# Patient Record
Sex: Male | Born: 1970
Health system: Southern US, Community
[De-identification: ages and names within clinical notes are randomized; demographics above are authoritative.]

## PROBLEM LIST (undated history)

## (undated) DIAGNOSIS — G4730 Sleep apnea, unspecified: Secondary | ICD-10-CM

## (undated) DIAGNOSIS — I10 Essential (primary) hypertension: Secondary | ICD-10-CM

## (undated) DIAGNOSIS — H269 Unspecified cataract: Secondary | ICD-10-CM

## (undated) DIAGNOSIS — K219 Gastro-esophageal reflux disease without esophagitis: Secondary | ICD-10-CM

## (undated) HISTORY — DX: Sleep apnea, unspecified: G47.30

## (undated) HISTORY — PX: DENTAL RESTORATION/EXTRACTION WITH X-RAY: SHX5796

## (undated) HISTORY — DX: Unspecified cataract: H26.9

## (undated) HISTORY — DX: Essential (primary) hypertension: I10

## (undated) HISTORY — DX: Gastro-esophageal reflux disease without esophagitis: K21.9

---

## 2010-11-03 HISTORY — PX: CATARACT EXTRACTION: SUR2

## 2016-03-28 DIAGNOSIS — R7303 Prediabetes: Secondary | ICD-10-CM | POA: Diagnosis not present

## 2016-03-28 DIAGNOSIS — Z1389 Encounter for screening for other disorder: Secondary | ICD-10-CM | POA: Diagnosis not present

## 2016-03-28 DIAGNOSIS — I10 Essential (primary) hypertension: Secondary | ICD-10-CM | POA: Diagnosis not present

## 2016-03-28 DIAGNOSIS — M545 Low back pain: Secondary | ICD-10-CM | POA: Diagnosis not present

## 2016-03-28 DIAGNOSIS — E7800 Pure hypercholesterolemia, unspecified: Secondary | ICD-10-CM | POA: Diagnosis not present

## 2016-03-28 DIAGNOSIS — Z0000 Encounter for general adult medical examination without abnormal findings: Secondary | ICD-10-CM | POA: Diagnosis not present

## 2016-03-28 DIAGNOSIS — G4719 Other hypersomnia: Secondary | ICD-10-CM | POA: Diagnosis not present

## 2016-07-09 DIAGNOSIS — G4733 Obstructive sleep apnea (adult) (pediatric): Secondary | ICD-10-CM | POA: Diagnosis not present

## 2016-08-08 DIAGNOSIS — G4733 Obstructive sleep apnea (adult) (pediatric): Secondary | ICD-10-CM | POA: Diagnosis not present

## 2016-09-08 DIAGNOSIS — G4733 Obstructive sleep apnea (adult) (pediatric): Secondary | ICD-10-CM | POA: Diagnosis not present

## 2016-12-18 DIAGNOSIS — B354 Tinea corporis: Secondary | ICD-10-CM | POA: Diagnosis not present

## 2016-12-18 DIAGNOSIS — L918 Other hypertrophic disorders of the skin: Secondary | ICD-10-CM | POA: Diagnosis not present

## 2017-02-25 DIAGNOSIS — R197 Diarrhea, unspecified: Secondary | ICD-10-CM | POA: Diagnosis not present

## 2017-02-25 DIAGNOSIS — Z6836 Body mass index (BMI) 36.0-36.9, adult: Secondary | ICD-10-CM | POA: Diagnosis not present

## 2017-02-25 DIAGNOSIS — E669 Obesity, unspecified: Secondary | ICD-10-CM | POA: Diagnosis not present

## 2017-03-16 DIAGNOSIS — R197 Diarrhea, unspecified: Secondary | ICD-10-CM | POA: Diagnosis not present

## 2017-03-16 DIAGNOSIS — Z6835 Body mass index (BMI) 35.0-35.9, adult: Secondary | ICD-10-CM | POA: Diagnosis not present

## 2017-03-16 DIAGNOSIS — R1013 Epigastric pain: Secondary | ICD-10-CM | POA: Diagnosis not present

## 2017-03-16 DIAGNOSIS — I10 Essential (primary) hypertension: Secondary | ICD-10-CM | POA: Diagnosis not present

## 2017-03-18 ENCOUNTER — Encounter: Payer: Self-pay | Admitting: Gastroenterology

## 2017-03-18 DIAGNOSIS — R1013 Epigastric pain: Secondary | ICD-10-CM | POA: Diagnosis not present

## 2017-03-18 DIAGNOSIS — R197 Diarrhea, unspecified: Secondary | ICD-10-CM | POA: Diagnosis not present

## 2017-03-24 ENCOUNTER — Encounter: Payer: Self-pay | Admitting: Gastroenterology

## 2017-03-24 ENCOUNTER — Encounter (INDEPENDENT_AMBULATORY_CARE_PROVIDER_SITE_OTHER): Payer: Self-pay

## 2017-03-24 ENCOUNTER — Other Ambulatory Visit (INDEPENDENT_AMBULATORY_CARE_PROVIDER_SITE_OTHER): Payer: BLUE CROSS/BLUE SHIELD

## 2017-03-24 ENCOUNTER — Ambulatory Visit (INDEPENDENT_AMBULATORY_CARE_PROVIDER_SITE_OTHER): Payer: BLUE CROSS/BLUE SHIELD | Admitting: Gastroenterology

## 2017-03-24 VITALS — BP 136/90 | HR 80 | Ht 69.0 in | Wt 238.4 lb

## 2017-03-24 DIAGNOSIS — R1084 Generalized abdominal pain: Secondary | ICD-10-CM

## 2017-03-24 DIAGNOSIS — R194 Change in bowel habit: Secondary | ICD-10-CM

## 2017-03-24 DIAGNOSIS — K625 Hemorrhage of anus and rectum: Secondary | ICD-10-CM | POA: Insufficient documentation

## 2017-03-24 LAB — TSH: TSH: 1.48 u[IU]/mL (ref 0.35–4.50)

## 2017-03-24 LAB — COMPREHENSIVE METABOLIC PANEL
ALK PHOS: 94 U/L (ref 39–117)
ALT: 26 U/L (ref 0–53)
AST: 16 U/L (ref 0–37)
Albumin: 4.1 g/dL (ref 3.5–5.2)
BILIRUBIN TOTAL: 0.4 mg/dL (ref 0.2–1.2)
BUN: 13 mg/dL (ref 6–23)
CO2: 25 mEq/L (ref 19–32)
Calcium: 9.1 mg/dL (ref 8.4–10.5)
Chloride: 104 mEq/L (ref 96–112)
Creatinine, Ser: 1.04 mg/dL (ref 0.40–1.50)
GFR: 81.33 mL/min (ref >60.00)
GLUCOSE: 88 mg/dL (ref 70–99)
Potassium: 4 mEq/L (ref 3.5–5.1)
SODIUM: 137 meq/L (ref 135–145)
TOTAL PROTEIN: 7.3 g/dL (ref 6.0–8.3)

## 2017-03-24 LAB — CBC WITH DIFFERENTIAL/PLATELET
BASOS ABS: 0.1 10*3/uL (ref 0.0–0.1)
Basophils Relative: 0.6 % (ref 0.0–3.0)
Eosinophils Absolute: 0.2 10*3/uL (ref 0.0–0.7)
Eosinophils Relative: 1.8 % (ref 0.0–5.0)
HCT: 43.1 % (ref 39.0–52.0)
Hemoglobin: 14.7 g/dL (ref 13.0–17.0)
LYMPHS ABS: 3.2 10*3/uL (ref 0.7–4.0)
Lymphocytes Relative: 34 % (ref 12.0–46.0)
MCHC: 34.2 g/dL (ref 30.0–36.0)
MCV: 91.2 fl (ref 78.0–100.0)
MONO ABS: 1 10*3/uL (ref 0.1–1.0)
Monocytes Relative: 10.4 % (ref 3.0–12.0)
NEUTROS PCT: 53.2 % (ref 43.0–77.0)
Neutro Abs: 5.1 10*3/uL (ref 1.4–7.7)
Platelets: 370 10*3/uL (ref 150.0–400.0)
RBC: 4.72 Mil/uL (ref 4.22–5.81)
RDW: 13.6 % (ref 11.5–15.5)
WBC: 9.5 10*3/uL (ref 4.0–10.5)

## 2017-03-24 LAB — HIGH SENSITIVITY CRP: CRP, High Sensitivity: 22.46 mg/L — ABNORMAL HIGH (ref 0.000–5.000)

## 2017-03-24 MED ORDER — HYOSCYAMINE SULFATE SL 0.125 MG SL SUBL: 1.0000 | SUBLINGUAL_TABLET | Freq: Four times a day (QID) | SUBLINGUAL | 1 refills | Status: DC

## 2017-03-24 NOTE — Progress Notes (Signed)
03/24/2017 Jeffery Walker [id number] [birth year]   HISTORY OF PRESENT ILLNESS:  This is a 46 year old male who is new to our practice.  He presents to our office today at the request of Charlott Holler, FNP, for evaluation regarding abdominal pain and change in bowel habits.  He tells me that one month ago he had sudden onset of significant diarrhea, which he believes was due to some type of virus, etc. He says that after that he felt better for 3 or 4 days and then he started having abdominal pain. He describes it as a mid abdominal pain that feels like needles. He says that it usually lasts for 10-15 minutes then subsides. It's not necessarily associated with eating. He continues with loose stools that are also more frequent than what he used to have. He says that he used to move his bowels every other day or so. Still having 2 or 3 stools a day that are sometimes loose and watery. Has urgency as well. He's had loss of appetite and fatigue. Some bloating.  He says that when he was having all of the diarrhea he did see some bright red blood. He says that his bowel movements have been waking him up at 3:30 every morning.  Had stool studies performed by PCP but we do not have those results.   Past Medical History:  Diagnosis Date  . Hypertension    History reviewed. No pertinent surgical history.  reports that he has been smoking.  He has never used smokeless tobacco. He reports that he drinks alcohol. He reports that he does not use drugs. family history includes Diabetes in his mother; Heart disease in his father. No Known Allergies    Outpatient Encounter Prescriptions as of 03/24/2017  Medication Sig  . esomeprazole (NEXIUM) 20 MG capsule Take 20 mg by mouth daily at 12 noon.   No facility-administered encounter medications on file as of 03/24/2017.      REVIEW OF SYSTEMS  : All other systems reviewed and negative except where noted in the History of Present Illness.   PHYSICAL  EXAM: BP 136/90   Pulse 80   Ht 5\' 9"  (1.753 m)   Wt 238 lb 6.4 oz (108.1 kg)   BMI 35.21 kg/m  General: Well developed white male in no acute distress Head: Normocephalic and atraumatic Eyes:  Sclerae anicteric, conjunctiva pink. Ears: Normal auditory acuity Lungs: Clear throughout to auscultation Heart: Regular rate and rhythm Abdomen: Soft, non-distended. Normal bowel sounds.  Mild mid-abdominal TTP. Rectal:  Will be done at the time of colonoscopy. Musculoskeletal: Symmetrical with no gross deformities  Skin: No lesions on visible extremities Extremities: No edema  Neurological: Alert oriented x 4, grossly non-focal Psychological:  Alert and cooperative. Normal mood and affect  ASSESSMENT AND PLAN: -46 year old male with what sounds like an acute episode of gastroenteritis.  Those acute symptoms resolved but now with persistent change in bowel habits/more frequent/looser stools and abdominal pain.  I think that he may just have some post-infectious IBS symptoms, but due to patient's concern we will schedule a colonoscopy.  Also reports minimal amounts of rectal bleeding.  We will get results of stool studies that were ordered by his PCP last week.  I will check labs today including CBC, CMP, TSH, CRP.  Will try levsin 0.125 mg SL prn to see if that helps.  Suggested a daily probiotic as well.   CC:  No ref. provider found

## 2017-03-24 NOTE — Patient Instructions (Signed)
Please go to the basement level to have your labs drawn.  We have sent the following medications to your pharmacy for you to pick up at your convenience: 1. Levsin SL 0.125 mg  You have been scheduled for a colonoscopy. Please follow written instructions given to you at your visit today.  If you use inhalers (even only as needed), please bring them with you on the day of your procedure. Your physician has requested that you go to www.startemmi.com and enter the access code given to you at your visit today. This web site gives a general overview about your procedure. However, you should still follow specific instructions given to you by our office regarding your preparation for the procedure.  We have provided you with a sample of the Suprep for the colonoscopy.

## 2017-03-24 NOTE — Progress Notes (Signed)
I agree with the above note, plan 

## 2017-03-25 ENCOUNTER — Encounter: Payer: Self-pay | Admitting: Gastroenterology

## 2017-03-25 ENCOUNTER — Ambulatory Visit (AMBULATORY_SURGERY_CENTER): Payer: BLUE CROSS/BLUE SHIELD | Admitting: Gastroenterology

## 2017-03-25 VITALS — BP 122/73 | HR 64 | Temp 97.5°F | Resp 14 | Ht 69.0 in | Wt 238.0 lb

## 2017-03-25 DIAGNOSIS — R194 Change in bowel habit: Secondary | ICD-10-CM

## 2017-03-25 DIAGNOSIS — K529 Noninfective gastroenteritis and colitis, unspecified: Secondary | ICD-10-CM

## 2017-03-25 DIAGNOSIS — K633 Ulcer of intestine: Secondary | ICD-10-CM | POA: Diagnosis not present

## 2017-03-25 MED ORDER — SODIUM CHLORIDE 0.9 % IV SOLN: 500.0000 mL | INTRAVENOUS | Status: DC

## 2017-03-25 NOTE — Progress Notes (Signed)
Report to PACU, RN, vss, BBS= Clear.  

## 2017-03-25 NOTE — Op Note (Signed)
[facility] Patient Name: Jeffery Walker Procedure Date: 03/25/2017 1:51 PM MRN: [id number] Endoscopist: Milus Banister , MD Age: 46 Referring MD:  Date of Birth: 1971/01/14 Gender: Male Account #: [id number] Procedure:                Colonoscopy Indications:              Generalized abdominal pain, Clinically significant                            diarrhea of unexplained origin; for the poast 4-5                            weeks, elevated sed rate Medicines:                Monitored Anesthesia Care Procedure:                Pre-Anesthesia Assessment:                           - Prior to the procedure, a History and Physical                            was performed, and patient medications and                            allergies were reviewed. The patient's tolerance of                            previous anesthesia was also reviewed. The risks                            and benefits of the procedure and the sedation                            options and risks were discussed with the patient.                            All questions were answered, and informed consent                            was obtained. Prior Anticoagulants: The patient has                            taken no previous anticoagulant or antiplatelet                            agents. ASA Grade Assessment: II - A patient with                            mild systemic disease. After reviewing the risks                            and benefits, the patient was deemed in  satisfactory condition to undergo the procedure.                           After obtaining informed consent, the colonoscope                            was passed under direct vision. Throughout the                            procedure, the patient's blood pressure, pulse, and                            oxygen saturations were monitored continuously. The                            Colonoscope was introduced through  the anus and                            advanced to the the terminal ileum. The colonoscopy                            was performed without difficulty. The patient                            tolerated the procedure well. The quality of the                            bowel preparation was excellent. The terminal                            ileum, ileocecal valve, appendiceal orifice, and                            rectum were photographed. Scope In: 2:02:52 PM Scope Out: 2:13:41 PM Scope Withdrawal Time: 0 hours 7 minutes 33 seconds  Total Procedure Duration: 0 hours 10 minutes 49 seconds  Findings:                 Inflammation, mild to moderate in severity and                            characterized by congestion (edema), erosions,                            erythema and granularity was found in the terminal                            ileum. Biopsies were taken with a cold forceps for                            histology. Jar 1                           Moderate inflammation characterized by congestion                            (  edema), erosions and granularity was found in the                            right colon (transverse colon, at the hepatic                            flexure and in the ascending colon). Biopsies were                            taken with a cold forceps for histology. Jar 2                           The left colon and rectum appeared normal, biopsies                            taken. Jar 3.                           The exam was otherwise without abnormality on                            direct and retroflexion views. Complications:            No immediate complications. Estimated blood loss:                            None. Estimated Blood Loss:     Estimated blood loss: none. Impression:               - Mild to moderate inflammation in right colon and                            terminal ileum. Multiple biopsies taken. Recommendation:           - Patient has  a contact number available for                            emergencies. The signs and symptoms of potential                            delayed complications were discussed with the                            patient. Return to normal activities tomorrow.                            Written discharge instructions were provided to the                            patient.                           - Resume previous diet.                           - Continue present medications.                           -  Await pathology results. Milus Banister, MD 03/25/2017 2:22:08 PM This report has been signed electronically.

## 2017-03-25 NOTE — Progress Notes (Signed)
Called to room to assist during endoscopic procedure.  Patient ID and intended procedure confirmed with present staff. Received instructions for my participation in the procedure from the performing physician.  

## 2017-03-25 NOTE — Patient Instructions (Signed)
YOU HAD AN ENDOSCOPIC PROCEDURE TODAY AT THE [facility]:   Refer to the procedure report that was given to you for any specific questions about what was found during the examination.  If the procedure report does not answer your questions, please call your gastroenterologist to clarify.  If you requested that your care partner not be given the details of your procedure findings, then the procedure report has been included in a sealed envelope for you to review at your convenience later.  YOU SHOULD EXPECT: Some feelings of bloating in the abdomen. Passage of more gas than usual.  Walking can help get rid of the air that was put into your GI tract during the procedure and reduce the bloating. If you had a lower endoscopy (such as a colonoscopy or flexible sigmoidoscopy) you may notice spotting of blood in your stool or on the toilet paper. If you underwent a bowel prep for your procedure, you may not have a normal bowel movement for a few days.  Please Note:  You might notice some irritation and congestion in your nose or some drainage.  This is from the oxygen used during your procedure.  There is no need for concern and it should clear up in a day or so.  SYMPTOMS TO REPORT IMMEDIATELY:   Following lower endoscopy (colonoscopy or flexible sigmoidoscopy):  Excessive amounts of blood in the stool  Significant tenderness or worsening of abdominal pains  Swelling of the abdomen that is new, acute  Fever of 100F or higher   For urgent or emergent issues, a gastroenterologist can be reached at any hour by calling [phone].   DIET:  We do recommend a small meal at first, but then you may proceed to your regular diet.  Drink plenty of fluids but you should avoid alcoholic beverages for 24 hours.  ACTIVITY:  You should plan to take it easy for the rest of today and you should NOT DRIVE or use heavy machinery until tomorrow (because of the sedation medicines used during the test).     FOLLOW UP: Our staff will call the number listed on your records the next business day following your procedure to check on you and address any questions or concerns that you may have regarding the information given to you following your procedure. If we do not reach you, we will leave a message.  However, if you are feeling well and you are not experiencing any problems, there is no need to return our call.  We will assume that you have returned to your regular daily activities without incident.  If any biopsies were taken you will be contacted by phone or by letter within the next 1-3 weeks.  Please call us at [phone] if you have not heard about the biopsies in 3 weeks.    SIGNATURES/CONFIDENTIALITY: You and/or your care partner have signed paperwork which will be entered into your electronic medical record.  These signatures attest to the fact that that the information above on your After Visit Summary has been reviewed and is understood.  Full responsibility of the confidentiality of this discharge information lies with you and/or your care-partner.  Read all of the handouts given to you by your recovery room nurse. 

## 2017-03-26 ENCOUNTER — Telehealth: Payer: Self-pay

## 2017-03-26 ENCOUNTER — Telehealth: Payer: Self-pay | Admitting: *Deleted

## 2017-03-26 NOTE — Telephone Encounter (Signed)
Left message on f/u call 

## 2017-03-26 NOTE — Telephone Encounter (Signed)
Left message on answering machine. 

## 2017-03-31 ENCOUNTER — Telehealth: Payer: Self-pay | Admitting: Gastroenterology

## 2017-03-31 NOTE — Telephone Encounter (Signed)
Stool tests 03/18/17: all negative.  H pylori negative as well by stool.

## 2017-04-01 ENCOUNTER — Telehealth: Payer: Self-pay | Admitting: *Deleted

## 2017-04-01 NOTE — Telephone Encounter (Signed)
[facility] and left a second message for [place]. Asking if they have received stool study results. The patient brought in the stool sample on 03-18-2017.

## 2017-04-01 NOTE — Telephone Encounter (Signed)
Jeffery Walker have you has a chance to review the labs for this pt.  They are also waiting on path results (path is not resulted yet) Please advise

## 2017-04-01 NOTE — Telephone Encounter (Signed)
The labs were normal except for the CRP, which was elevated.  This makes sense due to the findings of inflammation seen on colonoscopy as CRP is elevated in the setting of inflammation.  Thank you,  Jess

## 2017-04-01 NOTE — Telephone Encounter (Signed)
Pt has been advised.  We will call him back after the path results are resulted

## 2018-03-01 DIAGNOSIS — L814 Other melanin hyperpigmentation: Secondary | ICD-10-CM | POA: Diagnosis not present

## 2018-03-01 DIAGNOSIS — C44212 Basal cell carcinoma of skin of right ear and external auricular canal: Secondary | ICD-10-CM | POA: Diagnosis not present

## 2018-03-01 DIAGNOSIS — D485 Neoplasm of uncertain behavior of skin: Secondary | ICD-10-CM | POA: Diagnosis not present

## 2018-03-17 DIAGNOSIS — C44212 Basal cell carcinoma of skin of right ear and external auricular canal: Secondary | ICD-10-CM | POA: Diagnosis not present

## 2019-03-25 DIAGNOSIS — R079 Chest pain, unspecified: Secondary | ICD-10-CM | POA: Diagnosis not present

## 2019-03-25 DIAGNOSIS — R7303 Prediabetes: Secondary | ICD-10-CM | POA: Diagnosis not present

## 2019-03-25 DIAGNOSIS — E7800 Pure hypercholesterolemia, unspecified: Secondary | ICD-10-CM | POA: Diagnosis not present

## 2019-03-25 DIAGNOSIS — R9431 Abnormal electrocardiogram [ECG] [EKG]: Secondary | ICD-10-CM | POA: Diagnosis not present

## 2019-03-25 DIAGNOSIS — I10 Essential (primary) hypertension: Secondary | ICD-10-CM | POA: Diagnosis not present

## 2019-03-29 ENCOUNTER — Encounter: Payer: Self-pay | Admitting: Cardiology

## 2019-03-29 ENCOUNTER — Other Ambulatory Visit: Payer: Self-pay

## 2019-03-29 ENCOUNTER — Telehealth (INDEPENDENT_AMBULATORY_CARE_PROVIDER_SITE_OTHER): Payer: BLUE CROSS/BLUE SHIELD | Admitting: Cardiology

## 2019-03-29 VITALS — BP 153/81 | HR 67 | Ht 69.0 in | Wt 243.0 lb

## 2019-03-29 DIAGNOSIS — I10 Essential (primary) hypertension: Secondary | ICD-10-CM | POA: Insufficient documentation

## 2019-03-29 DIAGNOSIS — R0789 Other chest pain: Secondary | ICD-10-CM | POA: Diagnosis not present

## 2019-03-29 DIAGNOSIS — F17210 Nicotine dependence, cigarettes, uncomplicated: Secondary | ICD-10-CM | POA: Diagnosis not present

## 2019-03-29 DIAGNOSIS — E782 Mixed hyperlipidemia: Secondary | ICD-10-CM | POA: Insufficient documentation

## 2019-03-29 DIAGNOSIS — I209 Angina pectoris, unspecified: Secondary | ICD-10-CM | POA: Diagnosis not present

## 2019-03-29 NOTE — Patient Instructions (Signed)
Medication Instructions:  Your physician recommends that you continue on your current medications as directed. Please refer to the Current Medication list given to you today.  If you need a refill on your cardiac medications before your next appointment, please call your pharmacy.   Lab work: NONE If you have labs (blood work) drawn today and your tests are completely normal, you will receive your results only by: Marland Kitchen MyChart Message (if you have MyChart) OR . A paper copy in the mail If you have any lab test that is abnormal or we need to change your treatment, we will call you to review the results.  Testing/Procedures: You will need to go to [facility] outpatient services to have a chest xray performed.       [address] [place] [address] Dept: [phone] Loc: [phone]  [place]  03/29/2019  You are scheduled for a Cardiac Catheterization on Friday, May 29 with Dr. Glenetta Hew.  1. Please arrive at the [facility] (Main Entrance A) at [facility]: [address] [place], [place] [address] at 7:00 AM (This time is two hours before your procedure to ensure your preparation). Free valet parking service is available.   Special note: Every effort is made to have your procedure done on time. Please understand that emergencies sometimes delay scheduled procedures.  2. Diet: Do not eat solid foods after midnight.  The patient may have clear liquids until 5am upon the day of the procedure.  3. Labs:NONE  4. Medication instructions in preparation for your procedure:   Contrast Allergy: No     Current Outpatient Medications (Cardiovascular):  .  isosorbide mononitrate (IMDUR) 30 MG 24 hr tablet, Take 30 mg by mouth daily. .  metoprolol succinate (TOPROL-XL) 25 MG 24 hr tablet, Take 1 tablet by mouth daily. .  nitroGLYCERIN (NITROSTAT) 0.4 MG SL tablet,  Place 1 tablet under the tongue every 5 (five) minutes as needed.     Current Outpatient Medications (Analgesics):  .  aspirin EC 81 MG tablet, Take 81 mg by mouth daily.     Current Outpatient Medications (Other):  .  esomeprazole (NEXIUM) 20 MG capsule, Take 20 mg by mouth daily at 12 noon. Marland Kitchen  Hyoscyamine Sulfate SL (LEVSIN/SL) 0.125 MG SUBL, Place 1 tablet under the tongue every 6 (six) hours.  Current Facility-Administered Medications (Other):  .  0.9 %  sodium chloride infusion *For reference purposes while preparing patient instructions.   Delete this med list prior to printing instructions for patient.*   On the morning of your procedure, take your Aspirin and any morning medicines NOT listed above.  You may use sips of water.  5. Plan for one night stay--bring personal belongings. 6. Bring a current list of your medications and current insurance cards. 7. You MUST have a responsible person to drive you home. 8. Someone MUST be with you the first 24 hours after you arrive home or your discharge will be delayed. 9. Please wear clothes that are easy to get on and off and wear slip-on shoes.  Thank you for allowing [place] to care for you!   -- [facility]  ?Follow-Up: At [facility], you and your health needs are our priority.  As part of our continuing mission to provide you with exceptional heart care, we have created designated Provider Care Teams.  These Care Teams include your primary Cardiologist (physician)  and Advanced Practice Providers (APPs -  Physician Assistants and Nurse Practitioners) who all work together to provide you with the care you need, when you need it. You will need a follow up appointment in 1 months.    Any Other Special Instructions Will Be Listed Below  Coronary Angiogram A coronary angiogram is an X-ray procedure that is used to examine the arteries in the heart. In this procedure, a dye (contrast dye) is  injected through a long, thin tube (catheter). The catheter is inserted through the groin, wrist, or arm. The dye is injected into each artery, then X-rays are taken to show if there is a blockage in the arteries of the heart. This procedure can also show if you have valve disease or a disease of the aorta, and it can be used to check the overall function of your heart muscle. You may have a coronary angiogram if:  You are having chest pain, or other symptoms of angina, and you are at risk for heart disease.  You have an abnormal electrocardiogram (ECG) or stress test.  You have chest pain and heart failure.  You are having irregular heart rhythms.  You and your health care provider determine that the benefits of the test information outweigh the risks of the procedure. Let your health care provider know about:  Any allergies you have, including allergies to contrast dye.  All medicines you are taking, including vitamins, herbs, eye drops, creams, and over-the-counter medicines.  Any problems you or family members have had with anesthetic medicines.  Any blood disorders you have.  Any surgeries you have had.  History of kidney problems or kidney failure.  Any medical conditions you have.  Whether you are pregnant or may be pregnant. What are the risks? Generally, this is a safe procedure. However, problems may occur, including:  Infection.  Allergic reaction to medicines or dyes that are used.  Bleeding from the access site or other locations.  Kidney injury, especially in people with impaired kidney function.  Stroke (rare).  Heart attack (rare).  Damage to other structures or organs. What happens before the procedure? Staying hydrated Follow instructions from your health care provider about hydration, which may include:  Up to 2 hours before the procedure - you may continue to drink clear liquids, such as water, clear fruit juice, black coffee, and plain tea. Eating  and drinking restrictions Follow instructions from your health care provider about eating and drinking, which may include:  8 hours before the procedure - stop eating heavy meals or foods such as meat, fried foods, or fatty foods.  6 hours before the procedure - stop eating light meals or foods, such as toast or cereal.  2 hours before the procedure - stop drinking clear liquids. General instructions  Ask your health care provider about: ? Changing or stopping your regular medicines. This is especially important if you are taking diabetes medicines or blood thinners. ? Taking medicines such as ibuprofen. These medicines can thin your blood. Do not take these medicines before your procedure if your health care provider instructs you not to, though aspirin may be recommended prior to coronary angiograms.  Plan to have someone take you home from the hospital or clinic.  You may need to have blood tests or X-rays done. What happens during the procedure?  An IV tube will be inserted into one of your veins.  You will be given one or more of the following: ? A medicine to help you  relax (sedative). ? A medicine to numb the area where the catheter will be inserted into an artery (local anesthetic).  To reduce your risk of infection: ? Your health care team will wash or sanitize their hands. ? Your skin will be washed with soap. ? Hair may be removed from the area where the catheter will be inserted.  You will be connected to a continuous ECG monitor.  The catheter will be inserted into an artery. The location may be in your groin, in your wrist, or in the fold of your arm (near your elbow).  A type of X-ray (fluoroscopy) will be used to help guide the catheter to the opening of the blood vessel that is being examined.  A dye will be injected into the catheter, and X-rays will be taken. The dye will help to show where any narrowing or blockages are located in the heart arteries.  Tell  your health care provider if you have any chest pain or trouble breathing during the procedure.  If blockages are found, your health care provider may perform another procedure, such as inserting a coronary stent. The procedure may vary among health care providers and hospitals. What happens after the procedure?  After the procedure, you will need to keep the area still for a few hours, or for as long as told by your health care provider. If the procedure is done through the groin, you will be instructed to not bend and not cross your legs.  The insertion site will be checked frequently.  The pulse in your foot or wrist will be checked frequently.  You may have additional blood tests, X-rays, and a test that records the electrical activity of your heart (ECG).  Do not drive for 24 hours if you were given a sedative. Summary  A coronary angiogram is an X-ray procedure that is used to look into the arteries in the heart.  During the procedure, a dye (contrast dye) is injected through a long, thin tube (catheter). The catheter is inserted through the groin, wrist, or arm.  Tell your health care provider about any allergies you have, including allergies to contrast dye.  After the procedure, you will need to keep the area still for a few hours, or for as long as told by your health care provider. This information is not intended to replace advice given to you by your health care provider. Make sure you discuss any questions you have with your health care provider. Document Released: 04/26/2003 Document Revised: 08/01/2016 Document Reviewed: 08/01/2016 Elsevier Interactive Patient Education  2019 [employer].

## 2019-03-29 NOTE — Addendum Note (Signed)
Addended by: Beckey Rutter on: 03/29/2019 01:12 PM   Modules accepted: Orders

## 2019-03-29 NOTE — Addendum Note (Signed)
Addended by: Beckey Rutter on: 03/29/2019 01:41 PM   Modules accepted: Orders

## 2019-03-29 NOTE — H&P (View-Only) (Signed)
Virtual Visit via Video Note   This visit type was conducted due to national recommendations for restrictions regarding the COVID-19 Pandemic (e.g. social distancing) in an effort to limit this patient's exposure and mitigate transmission in our community.  Due to his co-morbid illnesses, this patient is at least at moderate risk for complications without adequate follow up.  This format is felt to be most appropriate for this patient at this time.  All issues noted in this document were discussed and addressed.  A limited physical exam was performed with this format.  Please refer to the patient's chart for his consent to telehealth for [facility].   Date:  03/29/2019   ID:  Jeffery Walker, DOB 1971-10-29, MRN [id number]  Patient Location: Home Provider Location: Office  PCP:  Cyndi Bender, PA-C  Cardiologist:  No primary care provider on file.  Electrophysiologist:  None   Evaluation Performed:  New Patient Evaluation  Chief Complaint: Chest tightness  History of Present Illness:    Jeffery Walker is a 48 y.o. male with past medical history of essential hypertension, dyslipidemia which is recently diagnosed, abnormal fasting sugars without a diagnosis of diabetes, overweight and active heavy smoker.  Patient mentions to me that in the past several weeks whenever he has felt upset he has had chest tightness with numb sensation in the elbow.  No orthopnea or PND.  He takes care of activities of daily living.  I asked him specifically whether he has the symptoms and sexual activity and he answers in the negative.  He does not have a regular exercise protocol.  No history of syncope or dizziness.  At the time of my evaluation, the patient is alert awake oriented and in no distress.  The patient does not have symptoms concerning for COVID-19 infection (fever, chills, cough, or new shortness of breath).    Past Medical History:  Diagnosis Date  . Cataract   . GERD (gastroesophageal  reflux disease)   . Hypertension   . Sleep apnea    Past Surgical History:  Procedure Laterality Date  . CATARACT EXTRACTION  2012   right eye  . DENTAL RESTORATION/EXTRACTION WITH X-RAY       Current Meds  Medication Sig  . aspirin EC 81 MG tablet Take 81 mg by mouth daily.  Marland Kitchen esomeprazole (NEXIUM) 20 MG capsule Take 20 mg by mouth daily at 12 noon.  Marland Kitchen Hyoscyamine Sulfate SL (LEVSIN/SL) 0.125 MG SUBL Place 1 tablet under the tongue every 6 (six) hours.  . isosorbide mononitrate (IMDUR) 30 MG 24 hr tablet Take 30 mg by mouth daily.  . metoprolol succinate (TOPROL-XL) 25 MG 24 hr tablet Take 1 tablet by mouth daily.  . nitroGLYCERIN (NITROSTAT) 0.4 MG SL tablet Place 1 tablet under the tongue every 5 (five) minutes as needed.   Current Facility-Administered Medications for the 03/29/19 encounter (Telemedicine) with Amyrah Pinkhasov, Reita Cliche, MD  Medication  . 0.9 %  sodium chloride infusion     Allergies:   Patient has no known allergies.   Social History   Tobacco Use  . Smoking status: Current Every Day Smoker  . Smokeless tobacco: Never Used  Substance Use Topics  . Alcohol use: Yes    Alcohol/week: 3.0 - 4.0 standard drinks    Types: 3 - 4 Cans of beer per week  . Drug use: No     Family Hx: The patient's family history includes Diabetes in his mother; Heart disease in his father. There  is no history of Colon cancer.  ROS:   Please see the history of present illness.    As mentioned above All other systems reviewed and are negative.   Prior CV studies:   The following studies were reviewed today:  I reviewed lab work, records and EKG from [employer] office extensively  Labs/Other Tests and Data Reviewed:    EKG:    Recent Labs: No results found for requested labs within last 8760 hours.   Recent Lipid Panel No results found for: CHOL, TRIG, HDL, CHOLHDL, LDLCALC, LDLDIRECT  Wt Readings from Last 3 Encounters:  03/29/19 243 lb (110.2 kg)  03/25/17 238 lb (108  kg)  03/24/17 238 lb 6.4 oz (108.1 kg)     Objective:    Vital Signs:  BP (!) 153/81 (BP Location: Left Arm, Patient Position: Sitting, Cuff Size: Normal)   Pulse 67   Ht 5\' 9"  (1.753 m)   Wt 243 lb (110.2 kg)   BMI 35.88 kg/m    VITAL SIGNS:  reviewed  ASSESSMENT & PLAN:    1. Angina pectoris: The patient's symptoms are very concerning.In view of the patient's symptoms, I discussed with the patient options for evaluation. Invasive and noninvasive options were given to the patient. I discussed stress testing and coronary angiography and left heart catheterization at length. Benefits, pros and cons of each approach were discussed at length. Patient had multiple questions which were answered to the patient's satisfaction. Patient opted for invasive evaluation and we will set up for coronary angiography and left heart catheterization. Further recommendations will be made based on the findings with coronary angiography. In the interim if the patient has any significant symptoms in hospital to the nearest emergency room. 2. Essential hypertension: Blood pressure is stable.  He appeared anxious during the visit today to some extent and this must be contributing to his symptoms. 3. Mixed dyslipidemia: Diet was discussed.  We will review this issue once he undergoes coronary angiography for need for urgent initiation of statin therapy. 4. Diet was discussed for obesity and abnormal blood sugars and weight reduction was stressed 5. Cigarette smoker: I spent 5 minutes with the patient discussing solely about smoking. Smoking cessation was counseled. I suggested to the patient also different medications and pharmacological interventions. Patient is keen to try stopping on its own at this time. He will get back to me if he needs any further assistance in this matter. 6. Sublingual nitroglycerin prescription was sent, its protocol and 911 protocol explained and the patient vocalized understanding questions  were answered to the patient's satisfaction  COVID-19 Education: The signs and symptoms of COVID-19 were discussed with the patient and how to seek care for testing (follow up with PCP or arrange E-visit).  The importance of social distancing was discussed today.  Time:   Today, I have spent 30 minutes with the patient with telehealth technology discussing the above problems.  Total time including counseling and management and chart and record review was 45 minutes.  Medication Adjustments/Labs and Tests Ordered: Current medicines are reviewed at length with the patient today.  Concerns regarding medicines are outlined above.   Tests Ordered: No orders of the defined types were placed in this encounter.   Medication Changes: No orders of the defined types were placed in this encounter.   Disposition:  Follow up In 1 to 2 weeks after the coronary angiography.  Signed, Jenean Lindau, MD  03/29/2019 12:04 PM    [place]  [facility]  

## 2019-03-29 NOTE — Progress Notes (Signed)
Virtual Visit via Video Note   This visit type was conducted due to national recommendations for restrictions regarding the COVID-19 Pandemic (e.g. social distancing) in an effort to limit this patient's exposure and mitigate transmission in our community.  Due to his co-morbid illnesses, this patient is at least at moderate risk for complications without adequate follow up.  This format is felt to be most appropriate for this patient at this time.  All issues noted in this document were discussed and addressed.  A limited physical exam was performed with this format.  Please refer to the patient's chart for his consent to telehealth for [facility].   Date:  03/29/2019   ID:  Jeffery Walker, DOB 18-Sep-1971, MRN [id number]  Patient Location: Home Provider Location: Office  PCP:  Cyndi Bender, PA-C  Cardiologist:  No primary care provider on file.  Electrophysiologist:  None   Evaluation Performed:  New Patient Evaluation  Chief Complaint: Chest tightness  History of Present Illness:    Jeffery Walker is a 48 y.o. male with past medical history of essential hypertension, dyslipidemia which is recently diagnosed, abnormal fasting sugars without a diagnosis of diabetes, overweight and active heavy smoker.  Patient mentions to me that in the past several weeks whenever he has felt upset he has had chest tightness with numb sensation in the elbow.  No orthopnea or PND.  He takes care of activities of daily living.  I asked him specifically whether he has the symptoms and sexual activity and he answers in the negative.  He does not have a regular exercise protocol.  No history of syncope or dizziness.  At the time of my evaluation, the patient is alert awake oriented and in no distress.  The patient does not have symptoms concerning for COVID-19 infection (fever, chills, cough, or new shortness of breath).    Past Medical History:  Diagnosis Date  . Cataract   . GERD (gastroesophageal  reflux disease)   . Hypertension   . Sleep apnea    Past Surgical History:  Procedure Laterality Date  . CATARACT EXTRACTION  2012   right eye  . DENTAL RESTORATION/EXTRACTION WITH X-RAY       Current Meds  Medication Sig  . aspirin EC 81 MG tablet Take 81 mg by mouth daily.  Marland Kitchen esomeprazole (NEXIUM) 20 MG capsule Take 20 mg by mouth daily at 12 noon.  Marland Kitchen Hyoscyamine Sulfate SL (LEVSIN/SL) 0.125 MG SUBL Place 1 tablet under the tongue every 6 (six) hours.  . isosorbide mononitrate (IMDUR) 30 MG 24 hr tablet Take 30 mg by mouth daily.  . metoprolol succinate (TOPROL-XL) 25 MG 24 hr tablet Take 1 tablet by mouth daily.  . nitroGLYCERIN (NITROSTAT) 0.4 MG SL tablet Place 1 tablet under the tongue every 5 (five) minutes as needed.   Current Facility-Administered Medications for the 03/29/19 encounter (Telemedicine) with Leanne Sisler, Reita Cliche, MD  Medication  . 0.9 %  sodium chloride infusion     Allergies:   Patient has no known allergies.   Social History   Tobacco Use  . Smoking status: Current Every Day Smoker  . Smokeless tobacco: Never Used  Substance Use Topics  . Alcohol use: Yes    Alcohol/week: 3.0 - 4.0 standard drinks    Types: 3 - 4 Cans of beer per week  . Drug use: No     Family Hx: The patient's family history includes Diabetes in his mother; Heart disease in his father. There  is no history of Colon cancer.  ROS:   Please see the history of present illness.    As mentioned above All other systems reviewed and are negative.   Prior CV studies:   The following studies were reviewed today:  I reviewed lab work, records and EKG from [employer] office extensively  Labs/Other Tests and Data Reviewed:    EKG:    Recent Labs: No results found for requested labs within last 8760 hours.   Recent Lipid Panel No results found for: CHOL, TRIG, HDL, CHOLHDL, LDLCALC, LDLDIRECT  Wt Readings from Last 3 Encounters:  03/29/19 243 lb (110.2 kg)  03/25/17 238 lb (108  kg)  03/24/17 238 lb 6.4 oz (108.1 kg)     Objective:    Vital Signs:  BP (!) 153/81 (BP Location: Left Arm, Patient Position: Sitting, Cuff Size: Normal)   Pulse 67   Ht 5\' 9"  (1.753 m)   Wt 243 lb (110.2 kg)   BMI 35.88 kg/m    VITAL SIGNS:  reviewed  ASSESSMENT & PLAN:    1. Angina pectoris: The patient's symptoms are very concerning.In view of the patient's symptoms, I discussed with the patient options for evaluation. Invasive and noninvasive options were given to the patient. I discussed stress testing and coronary angiography and left heart catheterization at length. Benefits, pros and cons of each approach were discussed at length. Patient had multiple questions which were answered to the patient's satisfaction. Patient opted for invasive evaluation and we will set up for coronary angiography and left heart catheterization. Further recommendations will be made based on the findings with coronary angiography. In the interim if the patient has any significant symptoms in hospital to the nearest emergency room. 2. Essential hypertension: Blood pressure is stable.  He appeared anxious during the visit today to some extent and this must be contributing to his symptoms. 3. Mixed dyslipidemia: Diet was discussed.  We will review this issue once he undergoes coronary angiography for need for urgent initiation of statin therapy. 4. Diet was discussed for obesity and abnormal blood sugars and weight reduction was stressed 5. Cigarette smoker: I spent 5 minutes with the patient discussing solely about smoking. Smoking cessation was counseled. I suggested to the patient also different medications and pharmacological interventions. Patient is keen to try stopping on its own at this time. He will get back to me if he needs any further assistance in this matter. 6. Sublingual nitroglycerin prescription was sent, its protocol and 911 protocol explained and the patient vocalized understanding questions  were answered to the patient's satisfaction  COVID-19 Education: The signs and symptoms of COVID-19 were discussed with the patient and how to seek care for testing (follow up with PCP or arrange E-visit).  The importance of social distancing was discussed today.  Time:   Today, I have spent 30 minutes with the patient with telehealth technology discussing the above problems.  Total time including counseling and management and chart and record review was 45 minutes.  Medication Adjustments/Labs and Tests Ordered: Current medicines are reviewed at length with the patient today.  Concerns regarding medicines are outlined above.   Tests Ordered: No orders of the defined types were placed in this encounter.   Medication Changes: No orders of the defined types were placed in this encounter.   Disposition:  Follow up In 1 to 2 weeks after the coronary angiography.  Signed, Jenean Lindau, MD  03/29/2019 12:04 PM    [place]  [facility]  

## 2019-03-30 ENCOUNTER — Other Ambulatory Visit (HOSPITAL_COMMUNITY)
Admission: RE | Admit: 2019-03-30 | Discharge: 2019-03-30 | Disposition: A | Discharge status: Home / Self Care | Payer: BLUE CROSS/BLUE SHIELD | Source: Ambulatory Visit | Attending: Cardiology | Admitting: Cardiology

## 2019-03-30 DIAGNOSIS — Z1159 Encounter for screening for other viral diseases: Secondary | ICD-10-CM | POA: Diagnosis not present

## 2019-03-30 LAB — SARS CORONAVIRUS 2 BY RT PCR (HOSPITAL ORDER, PERFORMED IN ~~LOC~~ HOSPITAL LAB): SARS Coronavirus 2: NEGATIVE

## 2019-04-01 ENCOUNTER — Ambulatory Visit (HOSPITAL_COMMUNITY)
Admission: RE | Admit: 2019-04-01 | Discharge: 2019-04-01 | Disposition: A | Discharge status: Home / Self Care | Payer: BLUE CROSS/BLUE SHIELD | Attending: Cardiology | Admitting: Cardiology

## 2019-04-01 ENCOUNTER — Encounter (HOSPITAL_COMMUNITY)
Admission: RE | Disposition: A | Discharge status: Home / Self Care | Payer: Self-pay | Source: Home / Self Care | Attending: Cardiology

## 2019-04-01 ENCOUNTER — Other Ambulatory Visit: Payer: Self-pay

## 2019-04-01 DIAGNOSIS — I209 Angina pectoris, unspecified: Secondary | ICD-10-CM | POA: Diagnosis not present

## 2019-04-01 DIAGNOSIS — Z7982 Long term (current) use of aspirin: Secondary | ICD-10-CM | POA: Diagnosis not present

## 2019-04-01 DIAGNOSIS — R7309 Other abnormal glucose: Secondary | ICD-10-CM | POA: Diagnosis not present

## 2019-04-01 DIAGNOSIS — I10 Essential (primary) hypertension: Secondary | ICD-10-CM

## 2019-04-01 DIAGNOSIS — I2582 Chronic total occlusion of coronary artery: Secondary | ICD-10-CM | POA: Insufficient documentation

## 2019-04-01 DIAGNOSIS — Z7902 Long term (current) use of antithrombotics/antiplatelets: Secondary | ICD-10-CM | POA: Diagnosis not present

## 2019-04-01 DIAGNOSIS — I25119 Atherosclerotic heart disease of native coronary artery with unspecified angina pectoris: Secondary | ICD-10-CM

## 2019-04-01 DIAGNOSIS — Z955 Presence of coronary angioplasty implant and graft: Secondary | ICD-10-CM | POA: Diagnosis not present

## 2019-04-01 DIAGNOSIS — Z79899 Other long term (current) drug therapy: Secondary | ICD-10-CM | POA: Diagnosis not present

## 2019-04-01 DIAGNOSIS — F17210 Nicotine dependence, cigarettes, uncomplicated: Secondary | ICD-10-CM

## 2019-04-01 DIAGNOSIS — I2510 Atherosclerotic heart disease of native coronary artery without angina pectoris: Secondary | ICD-10-CM

## 2019-04-01 DIAGNOSIS — Z1159 Encounter for screening for other viral diseases: Secondary | ICD-10-CM | POA: Insufficient documentation

## 2019-04-01 DIAGNOSIS — E782 Mixed hyperlipidemia: Secondary | ICD-10-CM

## 2019-04-01 HISTORY — PX: CORONARY STENT INTERVENTION: CATH118234

## 2019-04-01 HISTORY — PX: LEFT HEART CATH AND CORONARY ANGIOGRAPHY: CATH118249

## 2019-04-01 LAB — POCT ACTIVATED CLOTTING TIME: Activated Clotting Time: 345 seconds

## 2019-04-01 SURGERY — LEFT HEART CATH AND CORONARY ANGIOGRAPHY
Anesthesia: LOCAL

## 2019-04-01 MED ORDER — FAMOTIDINE IN NACL 20-0.9 MG/50ML-% IV SOLN
INTRAVENOUS | Status: DC | PRN
  Administered 2019-04-01: 20 mg via INTRAVENOUS

## 2019-04-01 MED ORDER — LIDOCAINE HCL (PF) 1 % IJ SOLN
INTRAMUSCULAR | Status: AC
  Filled 2019-04-01: qty 30

## 2019-04-01 MED ORDER — FENTANYL CITRATE (PF) 100 MCG/2ML IJ SOLN
INTRAMUSCULAR | Status: AC
  Filled 2019-04-01: qty 2

## 2019-04-01 MED ORDER — VERAPAMIL HCL 2.5 MG/ML IV SOLN
INTRAVENOUS | Status: AC
  Filled 2019-04-01: qty 2

## 2019-04-01 MED ORDER — LIDOCAINE HCL (PF) 1 % IJ SOLN
INTRAMUSCULAR | Status: DC | PRN
  Administered 2019-04-01: 2 mL

## 2019-04-01 MED ORDER — HYDRALAZINE HCL 20 MG/ML IJ SOLN: 10.0000 mg | INTRAMUSCULAR | Status: DC | PRN

## 2019-04-01 MED ORDER — ACETAMINOPHEN 325 MG PO TABS: 650.0000 mg | ORAL_TABLET | ORAL | Status: DC | PRN

## 2019-04-01 MED ORDER — ASPIRIN EC 81 MG PO TBEC: 81.0000 mg | DELAYED_RELEASE_TABLET | Freq: Every day | ORAL | 0 refills | Status: DC

## 2019-04-01 MED ORDER — PANTOPRAZOLE SODIUM 40 MG PO TBEC: 40.0000 mg | DELAYED_RELEASE_TABLET | Freq: Every day | ORAL | 1 refills | Status: DC

## 2019-04-01 MED ORDER — SODIUM CHLORIDE 0.9% FLUSH: 3.0000 mL | Freq: Two times a day (BID) | INTRAVENOUS | Status: DC

## 2019-04-01 MED ORDER — MORPHINE SULFATE (PF) 2 MG/ML IV SOLN: 2.0000 mg | INTRAVENOUS | Status: DC | PRN

## 2019-04-01 MED ORDER — SODIUM CHLORIDE 0.9% FLUSH: 3.0000 mL | INTRAVENOUS | Status: DC | PRN

## 2019-04-01 MED ORDER — NITROGLYCERIN 1 MG/10 ML FOR IR/CATH LAB
INTRA_ARTERIAL | Status: DC | PRN
  Administered 2019-04-01 (×2): 200 ug via INTRACORONARY

## 2019-04-01 MED ORDER — HEPARIN SODIUM (PORCINE) 1000 UNIT/ML IJ SOLN
INTRAMUSCULAR | Status: DC | PRN
  Administered 2019-04-01 (×2): 5500 [IU] via INTRAVENOUS

## 2019-04-01 MED ORDER — HEPARIN (PORCINE) IN NACL 1000-0.9 UT/500ML-% IV SOLN
INTRAVENOUS | Status: AC
  Filled 2019-04-01: qty 1000

## 2019-04-01 MED ORDER — ATORVASTATIN CALCIUM 80 MG PO TABS
80.0000 mg | ORAL_TABLET | Freq: Every day | ORAL | Status: DC
  Filled 2019-04-01: qty 1

## 2019-04-01 MED ORDER — MIDAZOLAM HCL 2 MG/2ML IJ SOLN
INTRAMUSCULAR | Status: AC
  Filled 2019-04-01: qty 2

## 2019-04-01 MED ORDER — SODIUM CHLORIDE 0.9 % WEIGHT BASED INFUSION: 1.0000 mL/kg/h | INTRAVENOUS | Status: DC

## 2019-04-01 MED ORDER — VERAPAMIL HCL 2.5 MG/ML IV SOLN
INTRAVENOUS | Status: DC | PRN
  Administered 2019-04-01: 10 mL via INTRA_ARTERIAL

## 2019-04-01 MED ORDER — HEPARIN SODIUM (PORCINE) 1000 UNIT/ML IJ SOLN
INTRAMUSCULAR | Status: AC
  Filled 2019-04-01: qty 1

## 2019-04-01 MED ORDER — CLOPIDOGREL BISULFATE 300 MG PO TABS
ORAL_TABLET | ORAL | Status: AC
  Filled 2019-04-01: qty 2

## 2019-04-01 MED ORDER — ATORVASTATIN CALCIUM 80 MG PO TABS: 80.0000 mg | ORAL_TABLET | Freq: Every day | ORAL | 2 refills | Status: DC

## 2019-04-01 MED ORDER — HEPARIN (PORCINE) IN NACL 1000-0.9 UT/500ML-% IV SOLN
INTRAVENOUS | Status: AC
  Filled 2019-04-01: qty 500

## 2019-04-01 MED ORDER — ONDANSETRON HCL 4 MG/2ML IJ SOLN: 4.0000 mg | Freq: Four times a day (QID) | INTRAMUSCULAR | Status: DC | PRN

## 2019-04-01 MED ORDER — SODIUM CHLORIDE 0.9 % WEIGHT BASED INFUSION
3.0000 mL/kg/h | INTRAVENOUS | Status: DC
  Administered 2019-04-01: 3 mL/kg/h via INTRAVENOUS

## 2019-04-01 MED ORDER — SODIUM CHLORIDE 0.9 % IV SOLN: INTRAVENOUS | Status: AC

## 2019-04-01 MED ORDER — CLOPIDOGREL BISULFATE 300 MG PO TABS
ORAL_TABLET | ORAL | Status: DC | PRN
  Administered 2019-04-01: 600 mg via ORAL

## 2019-04-01 MED ORDER — ASPIRIN 81 MG PO CHEW: 81.0000 mg | CHEWABLE_TABLET | ORAL | Status: DC

## 2019-04-01 MED ORDER — NITROGLYCERIN 1 MG/10 ML FOR IR/CATH LAB
INTRA_ARTERIAL | Status: AC
  Filled 2019-04-01: qty 10

## 2019-04-01 MED ORDER — FENTANYL CITRATE (PF) 100 MCG/2ML IJ SOLN
INTRAMUSCULAR | Status: DC | PRN
  Administered 2019-04-01: 50 ug via INTRAVENOUS

## 2019-04-01 MED ORDER — ASPIRIN EC 81 MG PO TBEC: 81.0000 mg | DELAYED_RELEASE_TABLET | Freq: Every day | ORAL | Status: DC

## 2019-04-01 MED ORDER — CLOPIDOGREL BISULFATE 75 MG PO TABS: 75.0000 mg | ORAL_TABLET | Freq: Every day | ORAL | 6 refills | Status: DC

## 2019-04-01 MED ORDER — SODIUM CHLORIDE 0.9 % IV SOLN: 250.0000 mL | INTRAVENOUS | Status: DC | PRN

## 2019-04-01 MED ORDER — IOHEXOL 350 MG/ML SOLN
INTRAVENOUS | Status: DC | PRN
  Administered 2019-04-01: 230 mL via INTRA_ARTERIAL

## 2019-04-01 MED ORDER — CLOPIDOGREL BISULFATE 75 MG PO TABS: 75.0000 mg | ORAL_TABLET | Freq: Every day | ORAL | 0 refills | Status: DC

## 2019-04-01 MED ORDER — HEPARIN (PORCINE) IN NACL 1000-0.9 UT/500ML-% IV SOLN
INTRAVENOUS | Status: DC | PRN
  Administered 2019-04-01 (×3): 500 mL

## 2019-04-01 MED ORDER — LABETALOL HCL 5 MG/ML IV SOLN: 10.0000 mg | INTRAVENOUS | Status: DC | PRN

## 2019-04-01 MED ORDER — FAMOTIDINE IN NACL 20-0.9 MG/50ML-% IV SOLN
INTRAVENOUS | Status: AC
  Filled 2019-04-01: qty 50

## 2019-04-01 MED ORDER — MIDAZOLAM HCL 2 MG/2ML IJ SOLN
INTRAMUSCULAR | Status: DC | PRN
  Administered 2019-04-01: 2 mg via INTRAVENOUS

## 2019-04-01 MED ORDER — CLOPIDOGREL BISULFATE 75 MG PO TABS: 75.0000 mg | ORAL_TABLET | Freq: Every day | ORAL | Status: DC

## 2019-04-01 MED FILL — CLOPIDOGREL 75 MG TABLET: 75 | 30 days supply | Qty: 30 | Fill #0

## 2019-04-01 MED FILL — ASPIRIN LOW DOSE 81 MG TBEC: 81 | 30 days supply | Qty: 30 | Fill #0

## 2019-04-01 MED FILL — ATORVASTATIN CALCIUM 80 MG: 80 | 30 days supply | Qty: 30 | Fill #0

## 2019-04-01 SURGICAL SUPPLY — 24 items
BALLN SAPPHIRE 2.5X12 (BALLOONS) ×2
BALLN SAPPHIRE ~~LOC~~ 3.0X12 (BALLOONS) ×2 IMPLANT
BALLOON SAPPHIRE 2.5X12 (BALLOONS) ×1 IMPLANT
CATH INFINITI 5 FR 3DRC (CATHETERS) ×2 IMPLANT
CATH INFINITI 5FR ANG PIGTAIL (CATHETERS) ×2 IMPLANT
CATH INFINITI JR4 5F (CATHETERS) ×2 IMPLANT
CATH LAUNCHER 5F RADR (CATHETERS) ×1 IMPLANT
CATH OPTITORQUE TIG 4.0 5F (CATHETERS) ×2 IMPLANT
CATH VISTA GUIDE 6FR XBLAD3.5 (CATHETERS) ×2 IMPLANT
CATHETER LAUNCHER 5F RADR (CATHETERS) ×2
COVER DOME SNAP 22 D (MISCELLANEOUS) ×2 IMPLANT
DEVICE RAD COMP TR BAND LRG (VASCULAR PRODUCTS) ×2 IMPLANT
GLIDESHEATH SLEND SS 6F .021 (SHEATH) ×2 IMPLANT
GUIDEWIRE INQWIRE 1.5J.035X260 (WIRE) ×1 IMPLANT
INQWIRE 1.5J .035X260CM (WIRE) ×2
KIT ENCORE 26 ADVANTAGE (KITS) ×2 IMPLANT
KIT HEART LEFT (KITS) ×2 IMPLANT
PACK CARDIAC CATHETERIZATION (CUSTOM PROCEDURE TRAY) ×2 IMPLANT
SHEATH PROBE COVER 6X72 (BAG) ×2 IMPLANT
STENT RESOLUTE ONYX 2.75X15 (Permanent Stent) ×2 IMPLANT
SYR MEDRAD MARK 7 150ML (SYRINGE) IMPLANT
TRANSDUCER W/STOPCOCK (MISCELLANEOUS) ×2 IMPLANT
TUBING CIL FLEX 10 FLL-RA (TUBING) ×2 IMPLANT
WIRE ASAHI PROWATER 180CM (WIRE) ×2 IMPLANT

## 2019-04-01 NOTE — Interval H&P Note (Signed)
History and Physical Interval Note:  04/01/2019 9:24 AM  Jeffery Walker  has presented today for surgery, with the diagnosis of Angina - class III  The various methods of treatment have been discussed with the patient and family. After consideration of risks, benefits and other options for treatment, the patient has consented to  Procedure(s): LEFT HEART CATH AND CORONARY ANGIOGRAPHY (N/A) with possible PERCUTANEOUS CORONARY INTERVENTION   as a surgical intervention.  The patient's history has been reviewed, patient examined, no change in status, stable for surgery.  I have reviewed the patient's chart and labs.  Questions were answered to the patient's satisfaction.   Cath Lab Visit (complete for each Cath Lab visit)  Clinical Evaluation Leading to the Procedure:   ACS: No.  Non-ACS:    Anginal Classification: CCS III  Anti-ischemic medical therapy: Maximal Therapy (2 or more classes of medications)  Non-Invasive Test Results: No non-invasive testing performed  Prior CABG: No previous CABG   Glenetta Hew

## 2019-04-01 NOTE — Progress Notes (Signed)
Discussed stent, Plavix, restrictions, smoking cessation, diet, exercising, NTG, and CRPII. Pt receptive, he is planning on quitting smoking but not today. Discussed options. Will refer to [facility]. He understands the importance of Plavix. [id number] [place], ACSM 1:03 PM 04/01/2019

## 2019-04-01 NOTE — Discharge Instructions (Signed)
Drink plenty of fluids  °Keep right arm at or above heart level.  °Radial Site Care ° °This sheet gives you information about how to care for yourself after your procedure. Your health care provider may also give you more specific instructions. If you have problems or questions, contact your health care provider. °What can I expect after the procedure? °After the procedure, it is common to have: °· Bruising and tenderness at the catheter insertion area. °Follow these instructions at home: °Medicines °· Take over-the-counter and prescription medicines only as told by your health care provider. °Insertion site care °· Follow instructions from your health care provider about how to take care of your insertion site. Make sure you: °? Wash your hands with soap and water before you change your bandage (dressing). If soap and water are not available, use hand sanitizer. °? Change your dressing as told by your health care provider. °? Leave stitches (sutures), skin glue, or adhesive strips in place. These skin closures may need to stay in place for 2 weeks or longer. If adhesive strip edges start to loosen and curl up, you may trim the loose edges. Do not remove adhesive strips completely unless your health care provider tells you to do that. °· Check your insertion site every day for signs of infection. Check for: °? Redness, swelling, or pain. °? Fluid or blood. °? Pus or a bad smell. °? Warmth. °· Do not take baths, swim, or use a hot tub until your health care provider approves. °· You may shower 24-48 hours after the procedure, or as directed by your health care provider. °? Remove the dressing and gently wash the site with plain soap and water. °? Pat the area dry with a clean towel. °? Do not rub the site. That could cause bleeding. °· Do not apply powder or lotion to the site. °Activity ° °· For 24 hours after the procedure, or as directed by your health care provider: °? Do not flex or bend the affected arm. °? Do  not push or pull heavy objects with the affected arm. °? Do not drive yourself home from the hospital or clinic. You may drive 24 hours after the procedure unless your health care provider tells you not to. °? Do not operate machinery or power tools. °· Do not lift anything that is heavier than 10 lb (4.5 kg), or the limit that you are told, until your health care provider says that it is safe. °· Ask your health care provider when it is okay to: °? Return to work or school. °? Resume usual physical activities or sports. °? Resume sexual activity. °General instructions °· If the catheter site starts to bleed, raise your arm and put firm pressure on the site. If the bleeding does not stop, get help right away. This is a medical emergency. °· If you went home on the same day as your procedure, a responsible adult should be with you for the first 24 hours after you arrive home. °· Keep all follow-up visits as told by your health care provider. This is important. °Contact a health care provider if: °· You have a fever. °· You have redness, swelling, or yellow drainage around your insertion site. °Get help right away if: °· You have unusual pain at the radial site. °· The catheter insertion area swells very fast. °· The insertion area is bleeding, and the bleeding does not stop when you hold steady pressure on the area. °· Your arm or   hand becomes pale, cool, tingly, or numb. °These symptoms may represent a serious problem that is an emergency. Do not wait to see if the symptoms will go away. Get medical help right away. Call your local emergency services (911 in the [place] Do not drive yourself to the hospital. °Summary °· After the procedure, it is common to have bruising and tenderness at the site. °· Follow instructions from your health care provider about how to take care of your radial site wound. Check the wound every day for signs of infection. °· Do not lift anything that is heavier than 10 lb (4.5 kg), or the  limit that you are told, until your health care provider says that it is safe. °This information is not intended to replace advice given to you by your health care provider. Make sure you discuss any questions you have with your health care provider. °Document Released: 11/22/2010 Document Revised: 11/25/2017 Document Reviewed: 11/25/2017 °Elsevier Interactive Patient Education © 2019 [employer] ° °

## 2019-04-01 NOTE — Progress Notes (Signed)
Ria Comment, [place] in to see pt. Meds have been delivered to pt.

## 2019-04-01 NOTE — Progress Notes (Signed)
Swelling noted to right wrist below TRB pressure held for 10 minutes

## 2019-04-01 NOTE — Progress Notes (Signed)
Discharge instructions reviewed with pt and his wife (via telephone). Voices understanding  

## 2019-04-01 NOTE — Discharge Summary (Signed)
Discharge Summary/SAME DAY PCI    Patient ID: Jeffery Walker,  MRN: [id number], DOB/AGE: [birth year] 48 y.o.  Admit date: 04/01/2019 Discharge date: 04/01/2019  Primary Care Provider: Cyndi Bender Primary Cardiologist: Dr. Geraldo Pitter  Discharge Diagnoses    Principal Problem:   Angina, class III [facility]) Active Problems:   Essential hypertension   Mixed dyslipidemia   Cigarette smoker   Coronary artery disease   Allergies No Known Allergies  Diagnostic Studies/Procedures    Cath: 04/01/19   CULPRIT LESION: Prox LAD lesion is 95% stenosed. Just after 1st Diag/SP1 & before SP2/2nd Diag  A drug-eluting stent was successfully placed using a STENT RESOLUTE ONYX 2.75X15. -Tapered post dilation from 3.1-2.8 mm  Post intervention, there is a 0% residual stenosis.  ---------------------------------------------  Prox LAD to Mid LAD lesion is 20% stenosed with 80% stenosed side branch in Ost 2nd Diag.  Prox Cx to Mid Cx lesion is 45% stenosed with 45% stenosed side branch in Ost 1st Mrg.  Ost RCA to Prox RCA lesion is 60% stenosed. Prox RCA to Dist RCA lesion is 100% stenosed. RPDA fills via LAD septal collaterals  Post Atrio lesion is 100% stenosed --fills via left to right collaterals from Circumflex system  ---------------------------------------------  There is mild left ventricular systolic dysfunction. Severe basal inferior hypokinesis to akinesis  The left ventricular ejection fraction is 50-55% by visual estimate. LV end diastolic pressure is normal.   SUMMARY  Severe 3-vessel disease with CTO of RCA filling via left-to-right collaterals, and 95% proximal LAD just after 1st Diag/SP1 & prior to 2nd Diag (that also has ostial 80% - too small for PCI)  Successful DES PCI of LAD: Resolute Onyx DES 2.75 mm x 15 mm (3.1 mm)  Mildly reduced with inferior hypokinesis. Normal LVEDP  RECOMMENDATIONS  Anticipate same-day discharge after bed rest  We will start  high-dose statin along with Plavix and aspirin.  Medically managed occluded RCA given significant collaterals.  Recommend 2D echocardiogram to better assess EF  Follow-up with Dr. Rose Phi, MD _____________   History of Present Illness     48 y.o. male with past medical history of essential hypertension, dyslipidemia which is recently diagnosed, abnormal fasting sugars without a diagnosis of diabetes, overweight and active heavy smoker.  Patient reported at his office visit to Dr. Geraldo Pitter that in the past several weeks whenever he has felt upset he has had chest tightness with numb sensation in the elbow.  No orthopnea or PND.  He takes care of activities of daily living.  He was asked specifically whether he had the symptoms with sexual activity and he answers in the negative.  He did not have a regular exercise protocol.  No history of syncope or dizziness. Given his ongoing symptoms he was set up for outpatient cath.   Hospital Course     Underwent cardiac cath noted above with severe 3v disease. CTO of the RCA with left to right collaterals, and 95% pLAD just after 1st diag. Successful PCI/DESx1 to the pLAD. Plan for DAPT with ASA/plavix for at least 6 months. Added high dose statin and switched PPI to protonix. Seen by cardiac rehab while in short stay. No complications post cath. Radial site stable. Instructions/precautions regarding cath site care given prior to discharge.    Jeffery Walker was seen by Dr. Ellyn Hack and determined stable for discharge home. Follow up in the office has been arranged. Medications are listed below.   _____________  Discharge Vitals Blood pressure 138/74, pulse 65, temperature 98.5 F (36.9 C), temperature source Oral, resp. rate 16, height 5\' 9"  (1.753 m), weight 110.2 kg, SpO2 96 %.  Filed Weights   04/01/19 0723  Weight: 110.2 kg    Labs & Radiologic Studies    CBC No results for input(s): WBC, NEUTROABS, HGB, HCT, MCV,  PLT in the last 72 hours. Basic Metabolic Panel No results for input(s): NA, K, CL, CO2, GLUCOSE, BUN, CREATININE, CALCIUM, MG, PHOS in the last 72 hours. Liver Function Tests No results for input(s): AST, ALT, ALKPHOS, BILITOT, PROT, ALBUMIN in the last 72 hours. No results for input(s): LIPASE, AMYLASE in the last 72 hours. Cardiac Enzymes No results for input(s): CKTOTAL, CKMB, CKMBINDEX, TROPONINI in the last 72 hours. BNP Invalid input(s): POCBNP D-Dimer No results for input(s): DDIMER in the last 72 hours. Hemoglobin A1C No results for input(s): HGBA1C in the last 72 hours. Fasting Lipid Panel No results for input(s): CHOL, HDL, LDLCALC, TRIG, CHOLHDL, LDLDIRECT in the last 72 hours. Thyroid Function Tests No results for input(s): TSH, T4TOTAL, T3FREE, THYROIDAB in the last 72 hours.  Invalid input(s): FREET3 _____________  No results found. Disposition   Pt is being discharged home today in good condition.  Follow-up Plans & Appointments    Follow-up Information    Revankar, Reita Cliche, MD Follow up on 04/08/2019.   Specialty:  Cardiology Why:  at 1:15pm for your follow up appt. This will be a virtual visit through your mobile phone.  Contact information: [address] [place] [address] [phone]          Discharge Instructions    Amb Referral to Cardiac Rehabilitation   Complete by:  As directed    Diagnosis:   Coronary Stents PTCA     After initial evaluation and assessments completed: Virtual Based Care may be provided alone or in conjunction with Phase 2 Cardiac Rehab based on patient barriers.:  Yes       Discharge Medications     Medication List    STOP taking these medications   esomeprazole 20 MG capsule Commonly known as:  NEXIUM   GOODY HEADACHE PO     TAKE these medications   aspirin EC 81 MG tablet Take 1 tablet (81 mg total) by mouth daily.   atorvastatin 80 MG tablet Commonly known as:  Lipitor Take 1 tablet (80  mg total) by mouth daily.   cetirizine 10 MG tablet Commonly known as:  ZYRTEC Take 10 mg by mouth daily as needed for allergies.   clopidogrel 75 MG tablet Commonly known as:  Plavix Take 1 tablet (75 mg total) by mouth daily.   isosorbide mononitrate 30 MG 24 hr tablet Commonly known as:  IMDUR Take 30 mg by mouth daily.   metoprolol succinate 25 MG 24 hr tablet Commonly known as:  TOPROL-XL Take 25 mg by mouth daily.   nitroGLYCERIN 0.4 MG SL tablet Commonly known as:  NITROSTAT Place 1 tablet under the tongue every 5 (five) minutes as needed for chest pain.   pantoprazole 40 MG tablet Commonly known as:  Protonix Take 1 tablet (40 mg total) by mouth daily.        Acute coronary syndrome (MI, NSTEMI, STEMI, etc) this admission?: No.   Outstanding Labs/Studies   FLP/LFTs in 6 weeks.   Duration of Discharge Encounter   Greater than 30 minutes including physician time.  Signed, Reino Bellis NP-C 04/01/2019, 4:01 PM

## 2019-04-04 ENCOUNTER — Encounter (HOSPITAL_COMMUNITY): Payer: Self-pay | Admitting: Cardiology

## 2019-04-04 ENCOUNTER — Telehealth (HOSPITAL_COMMUNITY): Payer: Self-pay

## 2019-04-04 NOTE — Telephone Encounter (Signed)
Referral signed and received for this pt to participate in Cardiac Rehab.  Due to the departmental closure based on national recommendations for Covid-19 we are unable to provide group exercise at this time. Pt is aware of the closure and the option to participate in virtual cardiac rehab from phase I rehab staff. Follow up appt on 04/08/2019.   Continue to follow pt.

## 2019-04-08 ENCOUNTER — Telehealth (INDEPENDENT_AMBULATORY_CARE_PROVIDER_SITE_OTHER): Payer: BC Managed Care – PPO | Admitting: Cardiology

## 2019-04-08 ENCOUNTER — Telehealth: Payer: Self-pay | Admitting: *Deleted

## 2019-04-08 ENCOUNTER — Other Ambulatory Visit: Payer: Self-pay

## 2019-04-08 ENCOUNTER — Encounter: Payer: Self-pay | Admitting: Cardiology

## 2019-04-08 VITALS — BP 142/86 | HR 74 | Ht 69.0 in | Wt 226.0 lb

## 2019-04-08 DIAGNOSIS — E782 Mixed hyperlipidemia: Secondary | ICD-10-CM | POA: Diagnosis not present

## 2019-04-08 DIAGNOSIS — I10 Essential (primary) hypertension: Secondary | ICD-10-CM | POA: Diagnosis not present

## 2019-04-08 DIAGNOSIS — Z9889 Other specified postprocedural states: Secondary | ICD-10-CM

## 2019-04-08 DIAGNOSIS — I2510 Atherosclerotic heart disease of native coronary artery without angina pectoris: Secondary | ICD-10-CM | POA: Diagnosis not present

## 2019-04-08 DIAGNOSIS — F17210 Nicotine dependence, cigarettes, uncomplicated: Secondary | ICD-10-CM | POA: Diagnosis not present

## 2019-04-08 DIAGNOSIS — Z1329 Encounter for screening for other suspected endocrine disorder: Secondary | ICD-10-CM

## 2019-04-08 NOTE — Telephone Encounter (Signed)
  If they need an echocardiogram any sooner you can wherever we can get it done faster than this.

## 2019-04-08 NOTE — Telephone Encounter (Signed)
Pt's wife asking about echocardiogram. The Dr. Parks Ranger placed his stent last Friday said pt needed echo. Please advise.

## 2019-04-08 NOTE — Telephone Encounter (Signed)
Information relayed to Dr.RRR

## 2019-04-08 NOTE — Addendum Note (Signed)
Addended by: Beckey Rutter on: 04/08/2019 03:34 PM   Modules accepted: Orders

## 2019-04-08 NOTE — Patient Instructions (Addendum)
Medication Instructions:  Your physician recommends that you continue on your current medications as directed. Please refer to the Current Medication list given to you today.  If you need a refill on your cardiac medications before your next appointment, please call your pharmacy.   Lab work: Your physician recommends that you return for lab work a lipid, liver, TSH and BMP to be drawn.  If you have labs (blood work) drawn today and your tests are completely normal, you will receive your results only by: Marland Kitchen MyChart Message (if you have MyChart) OR . A paper copy in the mail If you have any lab test that is abnormal or we need to change your treatment, we will call you to review the results.  Testing/Procedures: Your physician has requested that you have an echocardiogram. YOU HAVE BEEN SCHEDULED TO HAVE THIS DONE ON 05/24/19 at 08:15 AM.Echocardiography is a painless test that uses sound waves to create images of your heart. It provides your doctor with information about the size and shape of your heart and how well your heart's chambers and valves are working. This procedure takes approximately one hour. There are no restrictions for this procedure.    Follow-Up: At [facility], you and your health needs are our priority.  As part of our continuing mission to provide you with exceptional heart care, we have created designated Provider Care Teams.  These Care Teams include your primary Cardiologist (physician) and Advanced Practice Providers (APPs -  Physician Assistants and Nurse Practitioners) who all work together to provide you with the care you need, when you need it. You will need a follow up appointment in 1 months.    Any Other Special Instructions Will Be Listed Below  Echocardiogram An echocardiogram is a procedure that uses painless sound waves (ultrasound) to produce an image of the heart. Images from an echocardiogram can provide important information about:  Signs of  coronary artery disease (CAD).  Aneurysm detection. An aneurysm is a weak or damaged part of an artery wall that bulges out from the normal force of blood pumping through the body.  Heart size and shape. Changes in the size or shape of the heart can be associated with certain conditions, including heart failure, aneurysm, and CAD.  Heart muscle function.  Heart valve function.  Signs of a past heart attack.  Fluid buildup around the heart.  Thickening of the heart muscle.  A tumor or infectious growth around the heart valves. Tell a health care provider about:  Any allergies you have.  All medicines you are taking, including vitamins, herbs, eye drops, creams, and over-the-counter medicines.  Any blood disorders you have.  Any surgeries you have had.  Any medical conditions you have.  Whether you are pregnant or may be pregnant. What are the risks? Generally, this is a safe procedure. However, problems may occur, including:  Allergic reaction to dye (contrast) that may be used during the procedure. What happens before the procedure? No specific preparation is needed. You may eat and drink normally. What happens during the procedure?   An IV tube may be inserted into one of your veins.  You may receive contrast through this tube. A contrast is an injection that improves the quality of the pictures from your heart.  A gel will be applied to your chest.  A wand-like tool (transducer) will be moved over your chest. The gel will help to transmit the sound waves from the transducer.  The sound waves will harmlessly  bounce off of your heart to allow the heart images to be captured in real-time motion. The images will be recorded on a computer. The procedure may vary among health care providers and hospitals. What happens after the procedure?  You may return to your normal, everyday life, including diet, activities, and medicines, unless your health care provider tells you  not to do that. Summary  An echocardiogram is a procedure that uses painless sound waves (ultrasound) to produce an image of the heart.  Images from an echocardiogram can provide important information about the size and shape of your heart, heart muscle function, heart valve function, and fluid buildup around your heart.  You do not need to do anything to prepare before this procedure. You may eat and drink normally.  After the echocardiogram is completed, you may return to your normal, everyday life, unless your health care provider tells you not to do that. This information is not intended to replace advice given to you by your health care provider. Make sure you discuss any questions you have with your health care provider. Document Released: 10/17/2000 Document Revised: 11/22/2016 Document Reviewed: 11/22/2016 Elsevier Interactive Patient Education  2019 [employer].

## 2019-04-08 NOTE — Progress Notes (Signed)
Virtual Visit via Video Note   This visit type was conducted due to national recommendations for restrictions regarding the COVID-19 Pandemic (e.g. social distancing) in an effort to limit this patient's exposure and mitigate transmission in our community.  Due to his co-morbid illnesses, this patient is at least at moderate risk for complications without adequate follow up.  This format is felt to be most appropriate for this patient at this time.  All issues noted in this document were discussed and addressed.  A limited physical exam was performed with this format.  Please refer to the patient's chart for his consent to telehealth for [facility].   Date:  04/08/2019   ID:  Jeffery Walker, DOB [birth year], MRN [id number] Patient Location: Home Provider Location: Home  PCP:  Cyndi Bender, PA-C  Cardiologist:  No primary care provider on file.  Electrophysiologist:  None   Evaluation Performed:  Follow-Up Visit  Chief Complaint: Coronary artery disease post stenting  History of Present Illness:    Jeffery Walker is a 48 y.o. male with past medical history of essential hypertension.  Patient was sent for coronary angiography by me and underwent coronary stenting of the proximal LAD.  Right coronary artery was completely occluded.  Patient denies any problems at this time and takes care of activities of daily living.  No chest pain orthopnea or PND.  He is walking about 20 minutes a day without any problems.  At the time of my evaluation, the patient is alert awake oriented and in no distress.  His daughter is a Marine scientist and is with him for this visit and is very supportive.  Unfortunately continues to smoke about half pack a day.  The patient does not have symptoms concerning for COVID-19 infection (fever, chills, cough, or new shortness of breath).    Past Medical History:  Diagnosis Date  . Cataract   . GERD (gastroesophageal reflux disease)   . Hypertension   . Sleep apnea     Past Surgical History:  Procedure Laterality Date  . CATARACT EXTRACTION  2012   right eye  . CORONARY STENT INTERVENTION N/A 04/01/2019   Procedure: CORONARY STENT INTERVENTION;  Surgeon: Leonie Man, MD;  Location: [place] [facility];  Service: Cardiovascular;  Laterality: N/A;  . DENTAL RESTORATION/EXTRACTION WITH X-RAY    . LEFT HEART CATH AND CORONARY ANGIOGRAPHY N/A 04/01/2019   Procedure: LEFT HEART CATH AND CORONARY ANGIOGRAPHY;  Surgeon: Leonie Man, MD;  Location: [place] [facility];  Service: Cardiovascular;  Laterality: N/A;     Current Meds  Medication Sig  . acetaminophen (TYLENOL) 500 MG tablet Take 500 mg by mouth every 6 (six) hours as needed.  Marland Kitchen aspirin EC 81 MG tablet Take 1 tablet (81 mg total) by mouth daily.  Marland Kitchen atorvastatin (LIPITOR) 80 MG tablet Take 1 tablet (80 mg total) by mouth daily.  . cetirizine (ZYRTEC) 10 MG tablet Take 10 mg by mouth daily as needed for allergies.  Marland Kitchen clopidogrel (PLAVIX) 75 MG tablet Take 1 tablet (75 mg total) by mouth daily.  . isosorbide mononitrate (IMDUR) 30 MG 24 hr tablet Take 30 mg by mouth daily.  . metoprolol succinate (TOPROL-XL) 25 MG 24 hr tablet Take 25 mg by mouth daily.   . nitroGLYCERIN (NITROSTAT) 0.4 MG SL tablet Place 1 tablet under the tongue every 5 (five) minutes as needed for chest pain.   . pantoprazole (PROTONIX) 40 MG tablet Take 1 tablet (40 mg total) by  mouth daily.     Allergies:   Patient has no known allergies.   Social History   Tobacco Use  . Smoking status: Current Every Day Smoker  . Smokeless tobacco: Never Used  Substance Use Topics  . Alcohol use: Yes    Alcohol/week: 3.0 - 4.0 standard drinks    Types: 3 - 4 Cans of beer per week  . Drug use: No     Family Hx: The patient's family history includes Diabetes in his mother; Heart disease in his father. There is no history of Colon cancer.  ROS:   Please see the history of present illness.    As mentioned above All other systems  reviewed and are negative.   Prior CV studies:   The following studies were reviewed today:  Reading physician: Leonie Man, MD Ordering physician: Leonie Man, MD Study date: 04/01/19  Physicians   Panel Physicians Referring Physician Case Authorizing Physician  Leonie Man, MD (Primary)    Procedures   CORONARY STENT INTERVENTION  LEFT HEART CATH AND CORONARY ANGIOGRAPHY  Conclusion     CULPRIT LESION: Prox LAD lesion is 95% stenosed. Just after 1st Diag/SP1 & before SP2/2nd Diag  A drug-eluting stent was successfully placed using a STENT RESOLUTE ONYX 2.75X15. -Tapered post dilation from 3.1-2.8 mm  Post intervention, there is a 0% residual stenosis.  ---------------------------------------------  Prox LAD to Mid LAD lesion is 20% stenosed with 80% stenosed side branch in Ost 2nd Diag.  Prox Cx to Mid Cx lesion is 45% stenosed with 45% stenosed side branch in Ost 1st Mrg.  Ost RCA to Prox RCA lesion is 60% stenosed. Prox RCA to Dist RCA lesion is 100% stenosed. RPDA fills via LAD septal collaterals  Post Atrio lesion is 100% stenosed --fills via left to right collaterals from Circumflex system  ---------------------------------------------  There is mild left ventricular systolic dysfunction. Severe basal inferior hypokinesis to akinesis  The left ventricular ejection fraction is 50-55% by visual estimate. LV end diastolic pressure is normal.   SUMMARY  Severe 3-vessel disease with CTO of RCA filling via left-to-right collaterals, and 95% proximal LAD just after 1st Diag/SP1 & prior to 2nd Diag (that also has ostial 80% - too small for PCI)  Successful DES PCI of LAD: Resolute Onyx DES 2.75 mm x 15 mm (3.1 mm)  Mildly reduced with inferior hypokinesis. Normal LVEDP  RECOMMENDATIONS  Anticipate same-day discharge after bed rest  We will start high-dose statin along with Plavix and aspirin.  Medically managed occluded RCA given significant  collaterals.  Recommend 2D echocardiogram to better assess EF     Labs/Other Tests and Data Reviewed:    EKG:  No ECG reviewed.  Recent Labs: No results found for requested labs within last 8760 hours.   Recent Lipid Panel No results found for: CHOL, TRIG, HDL, CHOLHDL, LDLCALC, LDLDIRECT  Wt Readings from Last 3 Encounters:  04/08/19 226 lb (102.5 kg)  04/01/19 243 lb (110.2 kg)  03/29/19 243 lb (110.2 kg)     Objective:    Vital Signs:  BP (!) 142/86 (BP Location: Left Arm, Patient Position: Sitting, Cuff Size: Normal)   Pulse 74   Ht 5\' 9"  (1.753 m)   Wt 226 lb (102.5 kg)   BMI 33.37 kg/m    VITAL SIGNS:  reviewed  ASSESSMENT & PLAN:    1. Coronary artery disease: The coronary angiography report has been detailed above.  Secondary prevention stressed with the patient.  Importance  of compliance with diet and he vocalized understanding. 2. Essential hypertension: His blood pressure is mildly elevated.  Lifestyle modification and salt intake issues were discussed and he will keep a track of his blood pressures. 3. Mixed dyslipidemia: Diet was discussed.  Patient will be seen in follow-up appointment in a month or earlier if he has any concerns.  At that time he will have complete blood work including Chem-7 TSH and liver lipid check. 4. Cigarette smoking: I spent 5 minutes with the patient discussing solely about smoking. Smoking cessation was counseled. I suggested to the patient also different medications and pharmacological interventions. Patient is keen to try stopping on its own at this time. He will get back to me if he needs any further assistance in this matter.  COVID-19 Education: The signs and symptoms of COVID-19 were discussed with the patient and how to seek care for testing (follow up with PCP or arrange E-visit).  The importance of social distancing was discussed today.  Time:   Today, I have spent 15 minutes with the patient with telehealth technology  discussing the above problems.     Medication Adjustments/Labs and Tests Ordered: Current medicines are reviewed at length with the patient today.  Concerns regarding medicines are outlined above.   Tests Ordered: No orders of the defined types were placed in this encounter.   Medication Changes: No orders of the defined types were placed in this encounter.   Disposition:  Follow up in 1 month(s)  Signed, Jenean Lindau, MD  04/08/2019 1:42 PM    [facility]

## 2019-04-12 ENCOUNTER — Telehealth: Payer: Self-pay

## 2019-04-12 NOTE — Telephone Encounter (Signed)
Patient states he was advised that he could go back to work by Dr. Docia Furl and needs a note. He walks a lot with minor lifting. He says he feels great now with no SOB, no pain or edema. Please advise if patient is released to full duty. Information relayed to Dr. Docia Furl.

## 2019-04-12 NOTE — Telephone Encounter (Signed)
Note generated, Dr.RRR signed, patient called and notified to pickup tomorrow between 4-*4:30 pm.

## 2019-04-12 NOTE — Telephone Encounter (Signed)
He can go back to work from Monday

## 2019-04-13 ENCOUNTER — Telehealth (HOSPITAL_COMMUNITY): Payer: Self-pay

## 2019-04-13 NOTE — Telephone Encounter (Signed)
Phoned pt regarding Virtual Cardiac Rehab. Pt is not interested in virtual cardiac rehab and would like to wait for program to open back up for in person visits. Will contact pt once department opens back up.    Carma Lair MS, ACSM CEP 10:09 AM 04/13/2019

## 2019-04-23 ENCOUNTER — Other Ambulatory Visit: Payer: Self-pay | Admitting: Cardiology

## 2019-04-29 ENCOUNTER — Telehealth (HOSPITAL_COMMUNITY): Payer: Self-pay

## 2019-04-29 NOTE — Telephone Encounter (Signed)
Pt insurance is active and benefits verified through [place]. Co-pay $0.00, DED $3,000.00/$3,000.00 met, out of pocket $3,500.00/$3,500.00 met, co-insurance 10%. No pre-authorization required. Passport, 04/29/2019 @ 11:51AM, BBW#[id number]

## 2019-05-02 ENCOUNTER — Telehealth: Payer: Self-pay | Admitting: *Deleted

## 2019-05-02 MED ORDER — ATORVASTATIN CALCIUM 80 MG PO TABS: 80.0000 mg | ORAL_TABLET | Freq: Every day | ORAL | 2 refills | Status: DC

## 2019-05-02 NOTE — Telephone Encounter (Signed)
Rx refill sent to pharmacy.  *STAT* If patient is at the pharmacy, call can be transferred to refill team.   1. Which medications need to be refilled? (please list name of each medication and dose if known) Lipitor 80mg , qd  2. Which pharmacy/location (including street and city if local pharmacy) is [facility] to [facility] to?[facility] [place]  3. Do they need a 30 day or 90 day supply? [place]

## 2019-05-03 ENCOUNTER — Other Ambulatory Visit: Payer: Self-pay | Admitting: *Deleted

## 2019-05-12 ENCOUNTER — Telehealth (HOSPITAL_COMMUNITY): Payer: Self-pay

## 2019-05-13 ENCOUNTER — Encounter (HOSPITAL_COMMUNITY): Payer: Self-pay

## 2019-05-13 NOTE — Progress Notes (Addendum)
Pt was contacted to schedule for cardiac rehab orientation. Pt will be mailed a letter today (7/10) with instructions on how to contact us. If pt has not contacted [place] by 7/24, their referral will be closed.

## 2019-05-17 ENCOUNTER — Other Ambulatory Visit: Payer: Self-pay | Admitting: Cardiology

## 2019-05-17 MED ORDER — PANTOPRAZOLE SODIUM 40 MG PO TBEC: 40.0000 mg | DELAYED_RELEASE_TABLET | Freq: Every day | ORAL | 1 refills | Status: DC

## 2019-05-24 ENCOUNTER — Other Ambulatory Visit: Payer: BC Managed Care – PPO

## 2019-05-25 ENCOUNTER — Ambulatory Visit: Payer: BC Managed Care – PPO | Admitting: Cardiology

## 2019-05-27 ENCOUNTER — Ambulatory Visit (INDEPENDENT_AMBULATORY_CARE_PROVIDER_SITE_OTHER): Payer: BC Managed Care – PPO

## 2019-05-27 ENCOUNTER — Other Ambulatory Visit: Payer: Self-pay

## 2019-05-27 DIAGNOSIS — I2510 Atherosclerotic heart disease of native coronary artery without angina pectoris: Secondary | ICD-10-CM | POA: Diagnosis not present

## 2019-05-27 DIAGNOSIS — Z9889 Other specified postprocedural states: Secondary | ICD-10-CM | POA: Diagnosis not present

## 2019-05-27 DIAGNOSIS — I10 Essential (primary) hypertension: Secondary | ICD-10-CM | POA: Diagnosis not present

## 2019-05-27 NOTE — Progress Notes (Signed)
Complete echocardiogram has been performed.  Jimmy Susi Goslin RDCS, RVT 

## 2019-05-30 ENCOUNTER — Telehealth: Payer: Self-pay

## 2019-05-30 NOTE — Telephone Encounter (Signed)
Information relayed, no further questions. Copy sent to Dr. Tobie Lords per Dr. Docia Furl request.

## 2019-05-30 NOTE — Telephone Encounter (Signed)
-----   Message from Jenean Lindau, MD sent at 05/27/2019  4:13 PM EDT ----- The results of the study is unremarkable. Please inform patient. I will discuss in detail at next appointment. Cc  primary care/referring physician Jenean Lindau, MD 05/27/2019 4:13 PM

## 2019-06-07 ENCOUNTER — Ambulatory Visit: Payer: BC Managed Care – PPO | Admitting: Cardiology

## 2019-06-08 DIAGNOSIS — I10 Essential (primary) hypertension: Secondary | ICD-10-CM | POA: Diagnosis not present

## 2019-06-08 DIAGNOSIS — Z1331 Encounter for screening for depression: Secondary | ICD-10-CM | POA: Diagnosis not present

## 2019-06-08 DIAGNOSIS — E7800 Pure hypercholesterolemia, unspecified: Secondary | ICD-10-CM | POA: Diagnosis not present

## 2019-06-08 DIAGNOSIS — I2510 Atherosclerotic heart disease of native coronary artery without angina pectoris: Secondary | ICD-10-CM | POA: Diagnosis not present

## 2019-06-10 ENCOUNTER — Other Ambulatory Visit: Payer: Self-pay | Admitting: Cardiology

## 2019-06-13 NOTE — Telephone Encounter (Signed)
[facility] [phone]

## 2019-06-16 ENCOUNTER — Other Ambulatory Visit: Payer: Self-pay

## 2019-06-16 ENCOUNTER — Encounter: Payer: Self-pay | Admitting: Cardiology

## 2019-06-16 ENCOUNTER — Ambulatory Visit (INDEPENDENT_AMBULATORY_CARE_PROVIDER_SITE_OTHER): Payer: BC Managed Care – PPO | Admitting: Cardiology

## 2019-06-16 VITALS — BP 138/78 | HR 68 | Ht 69.0 in | Wt 222.8 lb

## 2019-06-16 DIAGNOSIS — Z1329 Encounter for screening for other suspected endocrine disorder: Secondary | ICD-10-CM

## 2019-06-16 DIAGNOSIS — F17210 Nicotine dependence, cigarettes, uncomplicated: Secondary | ICD-10-CM | POA: Diagnosis not present

## 2019-06-16 DIAGNOSIS — E782 Mixed hyperlipidemia: Secondary | ICD-10-CM

## 2019-06-16 DIAGNOSIS — I10 Essential (primary) hypertension: Secondary | ICD-10-CM

## 2019-06-16 DIAGNOSIS — I2510 Atherosclerotic heart disease of native coronary artery without angina pectoris: Secondary | ICD-10-CM | POA: Diagnosis not present

## 2019-06-16 NOTE — Progress Notes (Signed)
Cardiology Office Note:    Date:  06/16/2019   ID:  Jeffery Walker, DOB [birth year], MRN [id number]  PCP:  Cyndi Bender, PA-C  Cardiologist:  Jenean Lindau, MD   Referring MD: Cyndi Bender, PA-C    ASSESSMENT:    1. Essential hypertension   2. Coronary artery disease involving native coronary artery of native heart without angina pectoris   3. Cigarette smoker   4. Mixed dyslipidemia    PLAN:    In order of problems listed above:  1. Coronary artery disease: Secondary prevention stressed with the patient.  Importance of compliance with diet and medication stressed and he vocalized understanding. 2. Essential hypertension: Blood pressure is stable 3. Mixed dyslipidemia: Diet was discussed he will be back in the next few days for blood work including fasting lipids 4. Cigarette smoker: I spent 5 minutes with the patient discussing solely about smoking. Smoking cessation was counseled. I suggested to the patient also different medications and pharmacological interventions. Patient is keen to try stopping on its own at this time. He will get back to me if he needs any further assistance in this matter. 5. Patient will be seen in follow-up appointment in 6 months or earlier if the patient has any concerns    Medication Adjustments/Labs and Tests Ordered: Current medicines are reviewed at length with the patient today.  Concerns regarding medicines are outlined above.  No orders of the defined types were placed in this encounter.  No orders of the defined types were placed in this encounter.    History of Present Illness:    Jeffery Walker is a 48 y.o. male who is being seen today for the evaluation of  for follow-up coronary artery disease at the request of Cyndi Bender, [place].  Patient is a pleasant 48 year old male.  He has past medical history of coronary artery disease.  Continue therapy report has been detailed below.  He denies any problems at this time and takes  care of activities of daily living.  No chest pain orthopnea or PND.  He does not exercises on a regular basis but tells me that he walks about 15,000 steps at work without any symptoms.  Unfortunately continues to smoke.  At the time of my evaluation, the patient is alert awake oriented and in no distress.  Past Medical History:  Diagnosis Date  . Cataract   . GERD (gastroesophageal reflux disease)   . Hypertension   . Sleep apnea     Past Surgical History:  Procedure Laterality Date  . CATARACT EXTRACTION  2012   right eye  . CORONARY STENT INTERVENTION N/A 04/01/2019   Procedure: CORONARY STENT INTERVENTION;  Surgeon: Leonie Man, MD;  Location: [facility];  Service: Cardiovascular;  Laterality: N/A;  . DENTAL RESTORATION/EXTRACTION WITH X-RAY    . LEFT HEART CATH AND CORONARY ANGIOGRAPHY N/A 04/01/2019   Procedure: LEFT HEART CATH AND CORONARY ANGIOGRAPHY;  Surgeon: Leonie Man, MD;  Location: [facility];  Service: Cardiovascular;  Laterality: N/A;    Current Medications: Current Meds  Medication Sig  . acetaminophen (TYLENOL) 500 MG tablet Take 500 mg by mouth every 6 (six) hours as needed.  Marland Kitchen aspirin 81 MG EC tablet TAKE 1 TABLET BY MOUTH EVERY DAY  . atorvastatin (LIPITOR) 80 MG tablet Take 1 tablet (80 mg total) by mouth daily.  . cetirizine (ZYRTEC) 10 MG tablet Take 10 mg by mouth daily as needed for allergies.  Marland Kitchen clopidogrel (  PLAVIX) 75 MG tablet Take 1 tablet (75 mg total) by mouth daily.  . isosorbide mononitrate (IMDUR) 30 MG 24 hr tablet Take 30 mg by mouth daily.  . metoprolol succinate (TOPROL-XL) 25 MG 24 hr tablet Take 25 mg by mouth daily.   . nitroGLYCERIN (NITROSTAT) 0.4 MG SL tablet Place 1 tablet under the tongue every 5 (five) minutes as needed for chest pain.   . pantoprazole (PROTONIX) 40 MG tablet TAKE 1 TABLET BY MOUTH EVERY DAY     Allergies:   Patient has no known allergies.   Social History   Socioeconomic History  .  Marital status: Married    Spouse name: Not on file  . Number of children: 1  . Years of education: Not on file  . Highest education level: Not on file  Occupational History  . Not on file  Social Needs  . Financial resource strain: Not on file  . Food insecurity    Worry: Not on file    Inability: Not on file  . Transportation needs    Medical: Not on file    Non-medical: Not on file  Tobacco Use  . Smoking status: Current Every Day Smoker  . Smokeless tobacco: Never Used  Substance and Sexual Activity  . Alcohol use: Yes    Alcohol/week: 3.0 - 4.0 standard drinks    Types: 3 - 4 Cans of beer per week  . Drug use: No  . Sexual activity: Not on file  Lifestyle  . Physical activity    Days per week: Not on file    Minutes per session: Not on file  . Stress: Not on file  Relationships  . Social Herbalist on phone: Not on file    Gets together: Not on file    Attends religious service: Not on file    Active member of club or organization: Not on file    Attends meetings of clubs or organizations: Not on file    Relationship status: Not on file  Other Topics Concern  . Not on file  Social History Narrative  . Not on file     Family History: The patient's family history includes Diabetes in his mother; Heart disease in his father. There is no history of Colon cancer.  ROS:   Please see the history of present illness.    All other systems reviewed and are negative.  EKGs/Labs/Other Studies Reviewed:    The following studies were reviewed today: Conclusion    CULPRIT LESION: Prox LAD lesion is 95% stenosed. Just after 1st Diag/SP1 & before SP2/2nd Diag  A drug-eluting stent was successfully placed using a STENT RESOLUTE ONYX 2.75X15. -Tapered post dilation from 3.1-2.8 mm  Post intervention, there is a 0% residual stenosis.  ---------------------------------------------  Prox LAD to Mid LAD lesion is 20% stenosed with 80% stenosed side branch in  Ost 2nd Diag.  Prox Cx to Mid Cx lesion is 45% stenosed with 45% stenosed side branch in Ost 1st Mrg.  Ost RCA to Prox RCA lesion is 60% stenosed. Prox RCA to Dist RCA lesion is 100% stenosed. RPDA fills via LAD septal collaterals  Post Atrio lesion is 100% stenosed --fills via left to right collaterals from Circumflex system  ---------------------------------------------  There is mild left ventricular systolic dysfunction. Severe basal inferior hypokinesis to akinesis  The left ventricular ejection fraction is 50-55% by visual estimate. LV end diastolic pressure is normal.   SUMMARY  Severe 3-vessel disease with CTO  of RCA filling via left-to-right collaterals, and 95% proximal LAD just after 1st Diag/SP1 & prior to 2nd Diag (that also has ostial 80% - too small for PCI)  Successful DES PCI of LAD: Resolute Onyx DES 2.75 mm x 15 mm (3.1 mm)  Mildly reduced with inferior hypokinesis. Normal LVEDP  RECOMMENDATIONS  Anticipate same-day discharge after bed rest  We will start high-dose statin along with Plavix and aspirin.  Medically managed occluded RCA given significant collaterals.  Recommend 2D echocardiogram to better assess EF       Recent Labs: No results found for requested labs within last 8760 hours.  Recent Lipid Panel No results found for: CHOL, TRIG, HDL, CHOLHDL, VLDL, LDLCALC, LDLDIRECT  Physical Exam:    VS:  BP 138/78 (BP Location: Right Arm, Patient Position: Sitting)   Pulse 68   Ht 5\' 9"  (1.753 m)   Wt 222 lb 12.8 oz (101.1 kg)   SpO2 97%   BMI 32.90 kg/m     Wt Readings from Last 3 Encounters:  06/16/19 222 lb 12.8 oz (101.1 kg)  04/08/19 226 lb (102.5 kg)  04/01/19 243 lb (110.2 kg)     GEN: Patient is in no acute distress HEENT: Normal NECK: No JVD; No carotid bruits LYMPHATICS: No lymphadenopathy CARDIAC: S1 S2 regular, 2/6 systolic murmur at the apex. RESPIRATORY:  Clear to auscultation without rales, wheezing or rhonchi   ABDOMEN: Soft, non-tender, non-distended MUSCULOSKELETAL:  No edema; No deformity  SKIN: Warm and dry NEUROLOGIC:  Alert and oriented x 3 PSYCHIATRIC:  Normal affect    Signed, Jenean Lindau, MD  06/16/2019 2:45 PM    [facility]

## 2019-06-16 NOTE — Patient Instructions (Signed)
Medication Instructions:  Your physician recommends that you continue on your current medications as directed. Please refer to the Current Medication list given to you today.  If you need a refill on your cardiac medications before your next appointment, please call your pharmacy.   Lab work: Your physician recommends that you return FASTING for lipid, hepatic, TSH, CBC and BMET  If you have labs (blood work) drawn today and your tests are completely normal, you will receive your results only by: Marland Kitchen MyChart Message (if you have MyChart) OR . A paper copy in the mail If you have any lab test that is abnormal or we need to change your treatment, we will call you to review the results.  Testing/Procedures: NONE  Follow-Up: At [facility], you and your health needs are our priority.  As part of our continuing mission to provide you with exceptional heart care, we have created designated Provider Care Teams.  These Care Teams include your primary Cardiologist (physician) and Advanced Practice Providers (APPs -  Physician Assistants and Nurse Practitioners) who all work together to provide you with the care you need, when you need it. You will need a follow up appointment in 6 months.

## 2019-07-01 DIAGNOSIS — I10 Essential (primary) hypertension: Secondary | ICD-10-CM | POA: Diagnosis not present

## 2019-07-01 DIAGNOSIS — E782 Mixed hyperlipidemia: Secondary | ICD-10-CM | POA: Diagnosis not present

## 2019-07-01 DIAGNOSIS — I2510 Atherosclerotic heart disease of native coronary artery without angina pectoris: Secondary | ICD-10-CM | POA: Diagnosis not present

## 2019-07-01 DIAGNOSIS — Z1329 Encounter for screening for other suspected endocrine disorder: Secondary | ICD-10-CM | POA: Diagnosis not present

## 2019-07-01 LAB — CBC
Hematocrit: 43.2 % (ref 37.5–51.0)
Hemoglobin: 15 g/dL (ref 13.0–17.7)
MCH: 31.6 pg (ref 26.6–33.0)
MCHC: 34.7 g/dL (ref 31.5–35.7)
MCV: 91 fL (ref 79–97)
Platelets: 245 10*3/uL (ref 150–450)
RBC: 4.75 x10E6/uL (ref 4.14–5.80)
RDW: 12.9 % (ref 11.6–15.4)
WBC: 7.2 10*3/uL (ref 3.4–10.8)

## 2019-07-01 LAB — TSH: TSH: 1.76 u[IU]/mL (ref 0.450–4.500)

## 2019-07-01 LAB — BASIC METABOLIC PANEL
BUN/Creatinine Ratio: 8 — ABNORMAL LOW (ref 9–20)
BUN: 8 mg/dL (ref 6–24)
CO2: 23 mmol/L (ref 20–29)
Calcium: 9.6 mg/dL (ref 8.7–10.2)
Chloride: 100 mmol/L (ref 96–106)
Creatinine, Ser: 0.96 mg/dL (ref 0.76–1.27)
GFR calc Af Amer: 107 mL/min/{1.73_m2} (ref >59)
GFR calc non Af Amer: 92 mL/min/{1.73_m2} (ref >59)
Glucose: 97 mg/dL (ref 65–99)
Potassium: 4.7 mmol/L (ref 3.5–5.2)
Sodium: 138 mmol/L (ref 134–144)

## 2019-07-01 LAB — LIPID PANEL
Chol/HDL Ratio: 2.4 ratio (ref 0.0–5.0)
Cholesterol, Total: 89 mg/dL — ABNORMAL LOW (ref 100–199)
HDL: 37 mg/dL — ABNORMAL LOW (ref >39)
LDL Calculated: 44 mg/dL (ref 0–99)
Triglycerides: 42 mg/dL (ref 0–149)
VLDL Cholesterol Cal: 8 mg/dL (ref 5–40)

## 2019-07-06 ENCOUNTER — Telehealth: Payer: Self-pay

## 2019-07-06 NOTE — Telephone Encounter (Signed)
Information relayed, copy sent to Dr. Tobie Lords per Dr. Docia Furl request.

## 2019-07-06 NOTE — Telephone Encounter (Signed)
-----   Message from Jenean Lindau, MD sent at 07/02/2019  8:48 AM EDT ----- The results of the study is unremarkable. Reduce statin to half dose and repeat in 6 mo. Please inform patient. I will discuss in detail at next appointment. Cc  primary care/referring physician Jenean Lindau, MD 07/02/2019 8:48 AM

## 2019-08-10 ENCOUNTER — Other Ambulatory Visit: Payer: Self-pay

## 2019-08-10 NOTE — Telephone Encounter (Signed)
Always pcp 

## 2019-08-22 ENCOUNTER — Other Ambulatory Visit: Payer: Self-pay | Admitting: Cardiology

## 2019-08-24 NOTE — Telephone Encounter (Signed)
Plavix refill sent to [facility] in [place]

## 2019-08-24 NOTE — Telephone Encounter (Signed)
This is a [place] pt °

## 2019-08-29 ENCOUNTER — Other Ambulatory Visit: Payer: Self-pay | Admitting: Cardiology

## 2019-09-06 ENCOUNTER — Other Ambulatory Visit: Payer: Self-pay | Admitting: Cardiology

## 2019-09-14 DIAGNOSIS — Z23 Encounter for immunization: Secondary | ICD-10-CM | POA: Diagnosis not present

## 2019-10-01 ENCOUNTER — Other Ambulatory Visit: Payer: Self-pay | Admitting: Cardiology

## 2019-12-27 ENCOUNTER — Encounter: Payer: Self-pay | Admitting: Cardiology

## 2019-12-27 ENCOUNTER — Ambulatory Visit (INDEPENDENT_AMBULATORY_CARE_PROVIDER_SITE_OTHER): Payer: 59 | Admitting: Cardiology

## 2019-12-27 ENCOUNTER — Other Ambulatory Visit: Payer: Self-pay

## 2019-12-27 VITALS — BP 162/92 | HR 67 | Ht 69.0 in | Wt 225.0 lb

## 2019-12-27 DIAGNOSIS — E782 Mixed hyperlipidemia: Secondary | ICD-10-CM | POA: Diagnosis not present

## 2019-12-27 DIAGNOSIS — I2510 Atherosclerotic heart disease of native coronary artery without angina pectoris: Secondary | ICD-10-CM | POA: Diagnosis not present

## 2019-12-27 DIAGNOSIS — I10 Essential (primary) hypertension: Secondary | ICD-10-CM

## 2019-12-27 DIAGNOSIS — F17210 Nicotine dependence, cigarettes, uncomplicated: Secondary | ICD-10-CM

## 2019-12-27 MED ORDER — NITROGLYCERIN 0.4 MG SL SUBL: 0.4000 mg | SUBLINGUAL_TABLET | SUBLINGUAL | 6 refills | Status: DC | PRN

## 2019-12-27 NOTE — Addendum Note (Signed)
Addended by: Truddie Hidden on: 12/27/2019 02:08 PM   Modules accepted: Orders

## 2019-12-27 NOTE — Patient Instructions (Signed)
Medication Instructions:  No medication changes *If you need a refill on your cardiac medications before your next appointment, please call your pharmacy*  Lab Work: None ordered If you have labs (blood work) drawn today and your tests are completely normal, you will receive your results only by: . MyChart Message (if you have MyChart) OR . A paper copy in the mail If you have any lab test that is abnormal or we need to change your treatment, we will call you to review the results.  Testing/Procedures: None ordered  Follow-Up: At [facility], you and your health needs are our priority.  As part of our continuing mission to provide you with exceptional heart care, we have created designated Provider Care Teams.  These Care Teams include your primary Cardiologist (physician) and Advanced Practice Providers (APPs -  Physician Assistants and Nurse Practitioners) who all work together to provide you with the care you need, when you need it.  Your next appointment:   6 month(s)  The format for your next appointment:   In Person  Provider:   Rajan Revankar, MD  Other Instructions NA 

## 2019-12-27 NOTE — Progress Notes (Signed)
Cardiology Office Note:    Date:  12/27/2019   ID:  Jeffery Walker, DOB 21-Sep-1971, MRN [id number]  PCP:  Cyndi Bender, PA-C  Cardiologist:  Jenean Lindau, MD   Referring MD: Cyndi Bender, PA-C    ASSESSMENT:    1. Coronary artery disease involving native coronary artery of native heart without angina pectoris   2. Essential hypertension   3. Mixed dyslipidemia   4. Cigarette smoker    PLAN:    In order of problems listed above:  1. Coronary artery disease: Secondary prevention stressed with the patient.  Importance of compliance with diet and medication stressed and he vocalized understanding. 2. Essential hypertension: Blood pressure is stable.  He checks it regularly at home and it is in the 130/70 range he is going to keep a track of it and continue to do so. 3. Mixed dyslipidemia: He had blood work at primary care physician's office and we will try to get a copy of it. 4. Cigarette smoker: I spent 5 minutes with the patient discussing solely about smoking. Smoking cessation was counseled. I suggested to the patient also different medications and pharmacological interventions. Patient is keen to try stopping on its own at this time. He will get back to me if he needs any further assistance in this matter. 5. Patient will be seen in follow-up appointment in 6 months or earlier if the patient has any concerns    Medication Adjustments/Labs and Tests Ordered: Current medicines are reviewed at length with the patient today.  Concerns regarding medicines are outlined above.  No orders of the defined types were placed in this encounter.  No orders of the defined types were placed in this encounter.    Chief Complaint  Patient presents with  . Follow-up    6 Months     History of Present Illness:    Jeffery Walker is a 49 y.o. male.  Patient has past medical history of coronary artery disease post stenting as mentioned below and the details.  He has history of  essential hypertension dyslipidemia and unfortunately continues to smoke.  He denies any problems at this time and takes care of activities of daily living.  No chest pain orthopnea or PND.  At the time of my evaluation, the patient is alert awake oriented and in no distress.  Past Medical History:  Diagnosis Date  . Cataract   . GERD (gastroesophageal reflux disease)   . Hypertension   . Sleep apnea     Past Surgical History:  Procedure Laterality Date  . CATARACT EXTRACTION  2012   right eye  . CORONARY STENT INTERVENTION N/A 04/01/2019   Procedure: CORONARY STENT INTERVENTION;  Surgeon: Leonie Man, MD;  Location: [facility];  Service: Cardiovascular;  Laterality: N/A;  . DENTAL RESTORATION/EXTRACTION WITH X-RAY    . LEFT HEART CATH AND CORONARY ANGIOGRAPHY N/A 04/01/2019   Procedure: LEFT HEART CATH AND CORONARY ANGIOGRAPHY;  Surgeon: Leonie Man, MD;  Location: [facility];  Service: Cardiovascular;  Laterality: N/A;    Current Medications: Current Meds  Medication Sig  . acetaminophen (TYLENOL) 500 MG tablet Take 500 mg by mouth every 6 (six) hours as needed.  Marland Kitchen aspirin 81 MG EC tablet TAKE 1 TABLET BY MOUTH EVERY DAY  . atorvastatin (LIPITOR) 80 MG tablet TAKE 1 TABLET BY MOUTH EVERY DAY  . cetirizine (ZYRTEC) 10 MG tablet Take 10 mg by mouth daily as needed for allergies.  Marland Kitchen  clopidogrel (PLAVIX) 75 MG tablet TAKE 1 TABLET BY MOUTH EVERY DAY  . isosorbide mononitrate (IMDUR) 30 MG 24 hr tablet Take 30 mg by mouth daily.  . metoprolol succinate (TOPROL-XL) 25 MG 24 hr tablet Take 25 mg by mouth daily.   . nitroGLYCERIN (NITROSTAT) 0.4 MG SL tablet Place 1 tablet under the tongue every 5 (five) minutes as needed for chest pain.   . pantoprazole (PROTONIX) 40 MG tablet TAKE 1 TABLET BY MOUTH EVERY DAY     Allergies:   Patient has no known allergies.   Social History   Socioeconomic History  . Marital status: Married    Spouse name: Not on file  .  Number of children: 1  . Years of education: Not on file  . Highest education level: Not on file  Occupational History  . Not on file  Tobacco Use  . Smoking status: Current Every Day Smoker  . Smokeless tobacco: Never Used  Substance and Sexual Activity  . Alcohol use: Yes    Alcohol/week: 3.0 - 4.0 standard drinks    Types: 3 - 4 Cans of beer per week  . Drug use: No  . Sexual activity: Not on file  Other Topics Concern  . Not on file  Social History Narrative  . Not on file   Social Determinants of Health   Financial Resource Strain:   . Difficulty of Paying Living Expenses: Not on file  Food Insecurity:   . Worried About Charity fundraiser in the Last Year: Not on file  . Ran Out of Food in the Last Year: Not on file  Transportation Needs:   . Lack of Transportation (Medical): Not on file  . Lack of Transportation (Non-Medical): Not on file  Physical Activity:   . Days of Exercise per Week: Not on file  . Minutes of Exercise per Session: Not on file  Stress:   . Feeling of Stress : Not on file  Social Connections:   . Frequency of Communication with Friends and Family: Not on file  . Frequency of Social Gatherings with Friends and Family: Not on file  . Attends Religious Services: Not on file  . Active Member of Clubs or Organizations: Not on file  . Attends Archivist Meetings: Not on file  . Marital Status: Not on file     Family History: The patient's family history includes Diabetes in his mother; Heart disease in his father. There is no history of Colon cancer.  ROS:   Please see the history of present illness.    All other systems reviewed and are negative.  EKGs/Labs/Other Studies Reviewed:    The following studies were reviewed today: CARDIAC CATHETERIZATION Order# [id number] Reading physician: Leonie Man, MD Ordering physician: Leonie Man, MD Study date: 04/01/19  MyChart Results Release  MyChart Status: Active Results  Release  Physicians  Panel Physicians Referring Physician Case Authorizing Physician  Leonie Man, MD (Primary)    Procedures  CORONARY STENT INTERVENTION  LEFT HEART CATH AND CORONARY ANGIOGRAPHY  Conclusion    CULPRIT LESION: Prox LAD lesion is 95% stenosed. Just after 1st Diag/SP1 & before SP2/2nd Diag  A drug-eluting stent was successfully placed using a STENT RESOLUTE ONYX 2.75X15. -Tapered post dilation from 3.1-2.8 mm  Post intervention, there is a 0% residual stenosis.  ---------------------------------------------  Prox LAD to Mid LAD lesion is 20% stenosed with 80% stenosed side branch in Ost 2nd Diag.  Prox Cx to Mid Cx  lesion is 45% stenosed with 45% stenosed side branch in Ost 1st Mrg.  Ost RCA to Prox RCA lesion is 60% stenosed. Prox RCA to Dist RCA lesion is 100% stenosed. RPDA fills via LAD septal collaterals  Post Atrio lesion is 100% stenosed --fills via left to right collaterals from Circumflex system  ---------------------------------------------  There is mild left ventricular systolic dysfunction. Severe basal inferior hypokinesis to akinesis  The left ventricular ejection fraction is 50-55% by visual estimate. LV end diastolic pressure is normal.   SUMMARY  Severe 3-vessel disease with CTO of RCA filling via left-to-right collaterals, and 95% proximal LAD just after 1st Diag/SP1 & prior to 2nd Diag (that also has ostial 80% - too small for PCI)  Successful DES PCI of LAD: Resolute Onyx DES 2.75 mm x 15 mm (3.1 mm)  Mildly reduced with inferior hypokinesis. Normal LVEDP  RECOMMENDATIONS  Anticipate same-day discharge after bed rest  We will start high-dose statin along with Plavix and aspirin.  Medically managed occluded RCA given significant collaterals.  Recommend 2D echocardiogram to better assess EF  Follow-up with Dr. Rose Phi, MD      Recent Labs: 07/01/2019: BUN 8; Creatinine, Ser 0.96; Hemoglobin  15.0; Platelets 245; Potassium 4.7; Sodium 138; TSH 1.760  Recent Lipid Panel    Component Value Date/Time   CHOL 89 (L) 07/01/2019 0839   TRIG 42 07/01/2019 0839   HDL 37 (L) 07/01/2019 0839   CHOLHDL 2.4 07/01/2019 0839   LDLCALC 44 07/01/2019 0839    Physical Exam:    VS:  BP (!) 162/92   Pulse 67   Ht 5\' 9"  (1.753 m)   Wt 225 lb (102.1 kg)   SpO2 96%   BMI 33.23 kg/m     Wt Readings from Last 3 Encounters:  12/27/19 225 lb (102.1 kg)  06/16/19 222 lb 12.8 oz (101.1 kg)  04/08/19 226 lb (102.5 kg)     GEN: Patient is in no acute distress HEENT: Normal NECK: No JVD; No carotid bruits LYMPHATICS: No lymphadenopathy CARDIAC: Hear sounds regular, 2/6 systolic murmur at the apex. RESPIRATORY:  Clear to auscultation without rales, wheezing or rhonchi  ABDOMEN: Soft, non-tender, non-distended MUSCULOSKELETAL:  No edema; No deformity  SKIN: Warm and dry NEUROLOGIC:  Alert and oriented x 3 PSYCHIATRIC:  Normal affect   Signed, Jenean Lindau, MD  12/27/2019 2:04 PM    [place]

## 2019-12-30 ENCOUNTER — Telehealth: Payer: Self-pay | Admitting: Cardiology

## 2019-12-30 NOTE — Telephone Encounter (Signed)
Pt c/o BP issue: STAT if pt c/o blurred vision, one-sided weakness or slurred speech  1. What are your last 5 BP readings? 160/90 this morning, 175/97 yesterday morning, last night 174/96, 152/98 after taking an extra metoprolol, 156/92 an hour later, 166/95 before bed last night  2. Are you having any other symptoms (ex. Dizziness, headache, blurred vision, passed out)? no  3. What is your BP issue? Jeffery Walker, patient's step daughter states the patient's BP has continued to stay high. She says normally it would be high early in the day and then go down, but it has not been going down. Please advise.

## 2019-12-30 NOTE — Telephone Encounter (Signed)
Please call the patient and get a list of medications.  If the patient is not on ACE inhibitor then start lisinopril 10 mg daily and blood pressure checks and Chem-7 in a week.  Needs appointment also for follow-up of this.

## 2019-12-30 NOTE — Telephone Encounter (Signed)
I do not see Jeffery Walker listed on DPR. I placed call to patient and left message to call office. I returned call to Jeffery Walker and explained to her I was unable to discuss patient as she was not listed on his DPR.  She will contact patient and ask him to call the office.

## 2019-12-30 NOTE — Telephone Encounter (Signed)
I spoke with patient who gave permission to speak with Janett Billow in the future. Patient reports BP readings as listed below. He saw Dr Geraldo Pitter earlier this week and BP was elevated. He was told to check at home and call readings to office. He reports when his BP is elevated he feels hot and anxious.   He did take extra 25 mg Toprol last night. Today his BP was 160/90 prior to taking AM medications.  He is currently at work and has not rechecked. I told patient I would send BP readings to Dr Geraldo Pitter for review/recommendations.

## 2019-12-30 NOTE — Telephone Encounter (Signed)
Patient's step daughter Janett Billow) called back.  She is concerned about patient's BP continuing to be elevated and requested office visit. An appointment has been made for Monday. Janett Billow reports patient is very nervous about his BP.  Under a lot of stress at work. BP used to be initially elevated when he came home from work but then went down. Yesterday it did not go down.  Janett Billow is asking if there is something he could take as needed when BP elevated or parameters for taking extra dose of his medication when BP elevated.

## 2019-12-31 ENCOUNTER — Other Ambulatory Visit: Payer: Self-pay | Admitting: Cardiology

## 2020-01-02 ENCOUNTER — Encounter: Payer: Self-pay | Admitting: Cardiology

## 2020-01-02 ENCOUNTER — Other Ambulatory Visit: Payer: Self-pay

## 2020-01-02 ENCOUNTER — Ambulatory Visit (INDEPENDENT_AMBULATORY_CARE_PROVIDER_SITE_OTHER): Payer: 59 | Admitting: Cardiology

## 2020-01-02 VITALS — BP 174/90 | HR 80 | Ht 69.0 in | Wt 223.0 lb

## 2020-01-02 DIAGNOSIS — I10 Essential (primary) hypertension: Secondary | ICD-10-CM | POA: Diagnosis not present

## 2020-01-02 DIAGNOSIS — R0989 Other specified symptoms and signs involving the circulatory and respiratory systems: Secondary | ICD-10-CM | POA: Diagnosis not present

## 2020-01-02 DIAGNOSIS — E782 Mixed hyperlipidemia: Secondary | ICD-10-CM | POA: Diagnosis not present

## 2020-01-02 DIAGNOSIS — I2510 Atherosclerotic heart disease of native coronary artery without angina pectoris: Secondary | ICD-10-CM

## 2020-01-02 MED ORDER — LISINOPRIL 10 MG PO TABS: 10.0000 mg | ORAL_TABLET | Freq: Every day | ORAL | 3 refills | Status: DC

## 2020-01-02 NOTE — Patient Instructions (Signed)
Medication Instructions:  Your physician has recommended you make the following change in your medication:   Start Lisinopril 10 mg daily.  *If you need a refill on your cardiac medications before your next appointment, please call your pharmacy*   Lab Work: You need to have [place] in 2 weeks.  You can come Monday through Friday 8:30 am to 12:00 pm and 1:15 to 4:30. You do not need to make an appointment as the order has already been placed.  If you have labs (blood work) drawn today and your tests are completely normal, you will receive your results only by: Marland Kitchen MyChart Message (if you have MyChart) OR . A paper copy in the mail If you have any lab test that is abnormal or we need to change your treatment, we will call you to review the results.   Testing/Procedures: Your physician has requested that you have a carotid ultrasound. This test is an ultrasound of the carotid arteries in your neck. It looks at blood flow through these arteries that supply the brain with blood. Allow one hour for this exam. There are no restrictions or special instructions.    Follow-Up: At [facility], you and your health needs are our priority.  As part of our continuing mission to provide you with exceptional heart care, we have created designated Provider Care Teams.  These Care Teams include your primary Cardiologist (physician) and Advanced Practice Providers (APPs -  Physician Assistants and Nurse Practitioners) who all work together to provide you with the care you need, when you need it.  We recommend signing up for the patient portal called "MyChart".  Sign up information is provided on this After Visit Summary.  MyChart is used to connect with patients for Virtual Visits (Telemedicine).  Patients are able to view lab/test results, encounter notes, upcoming appointments, etc.  Non-urgent messages can be sent to your provider as well.   To learn more about what you can do with MyChart, go to  [email or web address].    Your next appointment:   1 month(s)  The format for your next appointment:   In Person  Provider:   Jyl Heinz, MD   Other Instructions NA

## 2020-01-02 NOTE — Progress Notes (Signed)
Cardiology Office Note:    Date:  01/02/2020   ID:  Jeffery Walker, DOB 06-25-1971, MRN [id number]  PCP:  Cyndi Bender, PA-C  Cardiologist:  Jenean Lindau, MD   Referring MD: Cyndi Bender, PA-C    ASSESSMENT:    1. Coronary artery disease involving native coronary artery of native heart without angina pectoris   2. Essential hypertension   3. Mixed dyslipidemia    PLAN:    In order of problems listed above:  1. Coronary artery disease: Secondary prevention stressed with the patient.  Importance of compliance with diet and medication stressed and he vocalized understanding.  Importance of regular exercise stressed. 2. Essential hypertension: Blood pressure is elevated and his readings at home are also elevated.  I will start him on lisinopril 10 mg daily he will be back in 2 weeks for blood work and get me blood pressure readings 3. Mixed dyslipidemia: Lipids followed by primary care physician and recently was told they were fine 4. Cigarette smoker: I spent 5 minutes with the patient discussing solely about smoking. Smoking cessation was counseled. I suggested to the patient also different medications and pharmacological interventions. Patient is keen to try stopping on its own at this time. He will get back to me if he needs any further assistance in this matter. 5. He has bilateral carotid bruit and we will do a carotid Doppler. 6. Patient will be seen in follow-up appointment in  month or earlier if the patient has any concerns    Medication Adjustments/Labs and Tests Ordered: Current medicines are reviewed at length with the patient today.  Concerns regarding medicines are outlined above.  No orders of the defined types were placed in this encounter.  No orders of the defined types were placed in this encounter.    Chief Complaint  Patient presents with  . Blood Pressure Issues     History of Present Illness:    Jeffery Walker is a 49 y.o. male.  Patient has  past medical history of coronary artery disease, essential hypertension and dyslipidemia.  Unfortunately continues to smoke.  He denies any chest pain orthopnea or PND.  He is concerned about his blood pressure.  At the time of my evaluation, the patient is alert awake oriented and in no distress.  He does not exercise on a regular basis.  Past Medical History:  Diagnosis Date  . Cataract   . GERD (gastroesophageal reflux disease)   . Hypertension   . Sleep apnea     Past Surgical History:  Procedure Laterality Date  . CATARACT EXTRACTION  2012   right eye  . CORONARY STENT INTERVENTION N/A 04/01/2019   Procedure: CORONARY STENT INTERVENTION;  Surgeon: Leonie Man, MD;  Location: [place] [facility];  Service: Cardiovascular;  Laterality: N/A;  . DENTAL RESTORATION/EXTRACTION WITH X-RAY    . LEFT HEART CATH AND CORONARY ANGIOGRAPHY N/A 04/01/2019   Procedure: LEFT HEART CATH AND CORONARY ANGIOGRAPHY;  Surgeon: Leonie Man, MD;  Location: [place] [facility];  Service: Cardiovascular;  Laterality: N/A;    Current Medications: Current Meds  Medication Sig  . acetaminophen (TYLENOL) 500 MG tablet Take 500 mg by mouth every 6 (six) hours as needed.  Marland Kitchen aspirin 81 MG EC tablet TAKE 1 TABLET BY MOUTH EVERY DAY  . atorvastatin (LIPITOR) 80 MG tablet TAKE 1 TABLET BY MOUTH EVERY DAY  . cetirizine (ZYRTEC) 10 MG tablet Take 10 mg by mouth daily as needed for allergies.  Marland Kitchen  CHANTIX CONTINUING MONTH PAK 1 MG tablet Take 1 mg by mouth as directed.  . clopidogrel (PLAVIX) 75 MG tablet TAKE 1 TABLET BY MOUTH EVERY DAY  . isosorbide mononitrate (IMDUR) 30 MG 24 hr tablet Take 30 mg by mouth daily.  . metoprolol succinate (TOPROL-XL) 25 MG 24 hr tablet Take 25 mg by mouth daily.   . nicotine (NICODERM CQ - DOSED IN MG/24 HOURS) 14 mg/24hr patch 14 mg every morning.  . nitroGLYCERIN (NITROSTAT) 0.4 MG SL tablet Place 1 tablet (0.4 mg total) under the tongue every 5 (five) minutes as needed for  chest pain.  . pantoprazole (PROTONIX) 40 MG tablet TAKE 1 TABLET BY MOUTH EVERY DAY     Allergies:   Patient has no known allergies.   Social History   Socioeconomic History  . Marital status: Married    Spouse name: Not on file  . Number of children: 1  . Years of education: Not on file  . Highest education level: Not on file  Occupational History  . Not on file  Tobacco Use  . Smoking status: Current Every Day Smoker  . Smokeless tobacco: Never Used  Substance and Sexual Activity  . Alcohol use: Yes    Alcohol/week: 3.0 - 4.0 standard drinks    Types: 3 - 4 Cans of beer per week  . Drug use: No  . Sexual activity: Not on file  Other Topics Concern  . Not on file  Social History Narrative  . Not on file   Social Determinants of Health   Financial Resource Strain:   . Difficulty of Paying Living Expenses: Not on file  Food Insecurity:   . Worried About Charity fundraiser in the Last Year: Not on file  . Ran Out of Food in the Last Year: Not on file  Transportation Needs:   . Lack of Transportation (Medical): Not on file  . Lack of Transportation (Non-Medical): Not on file  Physical Activity:   . Days of Exercise per Week: Not on file  . Minutes of Exercise per Session: Not on file  Stress:   . Feeling of Stress : Not on file  Social Connections:   . Frequency of Communication with Friends and Family: Not on file  . Frequency of Social Gatherings with Friends and Family: Not on file  . Attends Religious Services: Not on file  . Active Member of Clubs or Organizations: Not on file  . Attends Archivist Meetings: Not on file  . Marital Status: Not on file     Family History: The patient's family history includes Diabetes in his mother; Heart disease in his father. There is no history of Colon cancer.  ROS:   Please see the history of present illness.    All other systems reviewed and are negative.  EKGs/Labs/Other Studies Reviewed:    The  following studies were reviewed today: He recently had blood work from his primary care physician and was told it was fine.  I reviewed coronary angiography report with him at length.   Recent Labs: 07/01/2019: BUN 8; Creatinine, Ser 0.96; Hemoglobin 15.0; Platelets 245; Potassium 4.7; Sodium 138; TSH 1.760  Recent Lipid Panel    Component Value Date/Time   CHOL 89 (L) 07/01/2019 0839   TRIG 42 07/01/2019 0839   HDL 37 (L) 07/01/2019 0839   CHOLHDL 2.4 07/01/2019 0839   LDLCALC 44 07/01/2019 0839    Physical Exam:    VS:  BP (!) 174/90  Pulse 80   Ht 5\' 9"  (1.753 m)   Wt 223 lb (101.2 kg)   SpO2 96%   BMI 32.93 kg/m     Wt Readings from Last 3 Encounters:  01/02/20 223 lb (101.2 kg)  12/27/19 225 lb (102.1 kg)  06/16/19 222 lb 12.8 oz (101.1 kg)     GEN: Patient is in no acute distress HEENT: Normal NECK: No JVD; bilateral soft carotid bruits LYMPHATICS: No lymphadenopathy CARDIAC: Hear sounds regular, 2/6 systolic murmur at the apex. RESPIRATORY:  Clear to auscultation without rales, wheezing or rhonchi  ABDOMEN: Soft, non-tender, non-distended MUSCULOSKELETAL:  No edema; No deformity  SKIN: Warm and dry NEUROLOGIC:  Alert and oriented x 3 PSYCHIATRIC:  Normal affect   Signed, Jenean Lindau, MD  01/02/2020 10:01 AM    [place]

## 2020-01-03 ENCOUNTER — Telehealth: Payer: Self-pay | Admitting: Cardiology

## 2020-01-03 NOTE — Telephone Encounter (Signed)
Spoke with daughter, note in computer that it is ok to speak with her.  Advised her to reach out to PCP to obtain something for pt's nerves.  Daughter appreciative for call.

## 2020-01-03 NOTE — Telephone Encounter (Signed)
Jeffery Walker is calling requesting the patient be prescribed something for his nerves. She states he did not mention it at his appointment yesterday, but he gets so stressed from work it raises his BP. Please advise.

## 2020-01-04 NOTE — Telephone Encounter (Signed)
Called and spoke with the pt.  He just started the Lisinopril 10 mg on Monday.  He states that the list is correct in the computer as it was updated at his last visit.

## 2020-01-05 NOTE — Telephone Encounter (Signed)
Double lisinopril and we will keep a track of blood pressure when I see him next week in the office.

## 2020-01-05 NOTE — Telephone Encounter (Signed)
Called and spoke with Janett Billow regarding the pt's BP. She states that she feels that the pt may need medication to help with his nerves. She states that they have been in contact with his PCP for that issue. Janett Billow states that she feels that his BP is better and they will keep a log and get back with [place] next week.  Appointment will be made earlier if BP is not under control. Janett Billow agreed with this plan and will contact us of needed.

## 2020-01-12 ENCOUNTER — Other Ambulatory Visit: Payer: Self-pay

## 2020-01-12 ENCOUNTER — Ambulatory Visit (HOSPITAL_COMMUNITY)
Admission: RE | Admit: 2020-01-12 | Discharge: 2020-01-12 | Disposition: A | Discharge status: Home / Self Care | Payer: 59 | Source: Ambulatory Visit | Attending: Cardiology | Admitting: Cardiology

## 2020-01-12 DIAGNOSIS — I2510 Atherosclerotic heart disease of native coronary artery without angina pectoris: Secondary | ICD-10-CM | POA: Diagnosis not present

## 2020-01-12 DIAGNOSIS — R0989 Other specified symptoms and signs involving the circulatory and respiratory systems: Secondary | ICD-10-CM | POA: Diagnosis not present

## 2020-01-13 ENCOUNTER — Telehealth: Payer: Self-pay

## 2020-01-13 NOTE — Telephone Encounter (Signed)
Results reviewed with pt as per Dr. Revankar's note.  Pt verbalized understanding and had no additional questions.   

## 2020-02-14 ENCOUNTER — Ambulatory Visit: Payer: 59 | Admitting: Cardiology

## 2020-03-26 ENCOUNTER — Other Ambulatory Visit: Payer: Self-pay | Admitting: Cardiology

## 2020-04-06 ENCOUNTER — Other Ambulatory Visit: Payer: Self-pay | Admitting: Cardiology

## 2020-05-25 ENCOUNTER — Other Ambulatory Visit: Payer: Self-pay | Admitting: Cardiology

## 2020-07-01 ENCOUNTER — Other Ambulatory Visit: Payer: Self-pay | Admitting: Cardiology

## 2021-01-08 ENCOUNTER — Other Ambulatory Visit: Payer: Self-pay | Admitting: Cardiology

## 2021-01-30 ENCOUNTER — Other Ambulatory Visit (HOSPITAL_COMMUNITY): Payer: Self-pay | Admitting: Cardiology

## 2021-01-30 DIAGNOSIS — I6523 Occlusion and stenosis of bilateral carotid arteries: Secondary | ICD-10-CM

## 2021-05-09 ENCOUNTER — Encounter: Payer: Self-pay | Admitting: Internal Medicine

## 2021-05-09 ENCOUNTER — Ambulatory Visit: Payer: 59 | Admitting: Internal Medicine

## 2021-05-09 ENCOUNTER — Other Ambulatory Visit: Payer: Self-pay

## 2021-05-09 VITALS — BP 136/84 | HR 70 | Temp 98.9°F | Resp 16 | Ht 69.0 in | Wt 222.0 lb

## 2021-05-09 DIAGNOSIS — Z0001 Encounter for general adult medical examination with abnormal findings: Secondary | ICD-10-CM | POA: Diagnosis not present

## 2021-05-09 DIAGNOSIS — A0839 Other viral enteritis: Secondary | ICD-10-CM

## 2021-05-09 DIAGNOSIS — R197 Diarrhea, unspecified: Secondary | ICD-10-CM | POA: Diagnosis not present

## 2021-05-09 DIAGNOSIS — Z1159 Encounter for screening for other viral diseases: Secondary | ICD-10-CM | POA: Insufficient documentation

## 2021-05-09 DIAGNOSIS — I10 Essential (primary) hypertension: Secondary | ICD-10-CM | POA: Diagnosis not present

## 2021-05-09 DIAGNOSIS — E785 Hyperlipidemia, unspecified: Secondary | ICD-10-CM

## 2021-05-09 DIAGNOSIS — Z125 Encounter for screening for malignant neoplasm of prostate: Secondary | ICD-10-CM | POA: Diagnosis not present

## 2021-05-09 DIAGNOSIS — I2510 Atherosclerotic heart disease of native coronary artery without angina pectoris: Secondary | ICD-10-CM

## 2021-05-09 DIAGNOSIS — Z720 Tobacco use: Secondary | ICD-10-CM

## 2021-05-09 DIAGNOSIS — U071 COVID-19: Secondary | ICD-10-CM

## 2021-05-09 DIAGNOSIS — J411 Mucopurulent chronic bronchitis: Secondary | ICD-10-CM

## 2021-05-09 DIAGNOSIS — R059 Cough, unspecified: Secondary | ICD-10-CM

## 2021-05-09 DIAGNOSIS — E559 Vitamin D deficiency, unspecified: Secondary | ICD-10-CM

## 2021-05-09 LAB — CBC WITH DIFFERENTIAL/PLATELET
Basophils Absolute: 0 10*3/uL (ref 0.0–0.1)
Basophils Relative: 0.6 % (ref 0.0–3.0)
Eosinophils Absolute: 0.2 10*3/uL (ref 0.0–0.7)
Eosinophils Relative: 2 % (ref 0.0–5.0)
HCT: 42.9 % (ref 39.0–52.0)
Hemoglobin: 14.8 g/dL (ref 13.0–17.0)
Lymphocytes Relative: 25.7 % (ref 12.0–46.0)
Lymphs Abs: 2.1 10*3/uL (ref 0.7–4.0)
MCHC: 34.5 g/dL (ref 30.0–36.0)
MCV: 94.8 fl (ref 78.0–100.0)
Monocytes Absolute: 0.7 10*3/uL (ref 0.1–1.0)
Monocytes Relative: 8.5 % (ref 3.0–12.0)
Neutro Abs: 5.1 10*3/uL (ref 1.4–7.7)
Neutrophils Relative %: 63.2 % (ref 43.0–77.0)
Platelets: 254 10*3/uL (ref 150.0–400.0)
RBC: 4.53 Mil/uL (ref 4.22–5.81)
RDW: 13.4 % (ref 11.5–15.5)
WBC: 8 10*3/uL (ref 4.0–10.5)

## 2021-05-09 LAB — BASIC METABOLIC PANEL
BUN: 12 mg/dL (ref 6–23)
CO2: 28 mEq/L (ref 19–32)
Calcium: 9.3 mg/dL (ref 8.4–10.5)
Chloride: 101 mEq/L (ref 96–112)
Creatinine, Ser: 1.07 mg/dL (ref 0.40–1.50)
GFR: 80.51 mL/min (ref >60.00)
Glucose, Bld: 76 mg/dL (ref 70–99)
Potassium: 4.7 mEq/L (ref 3.5–5.1)
Sodium: 135 mEq/L (ref 135–145)

## 2021-05-09 LAB — URINALYSIS, ROUTINE W REFLEX MICROSCOPIC
Bilirubin Urine: NEGATIVE
Ketones, ur: NEGATIVE
Leukocytes,Ua: NEGATIVE
Nitrite: NEGATIVE
Specific Gravity, Urine: <=1.005 — AB (ref 1.000–1.030)
Total Protein, Urine: NEGATIVE
Urine Glucose: NEGATIVE
Urobilinogen, UA: 0.2 (ref 0.0–1.0)
WBC, UA: NONE SEEN (ref 0–?)
pH: 7 (ref 5.0–8.0)

## 2021-05-09 LAB — HEPATIC FUNCTION PANEL
ALT: 18 U/L (ref 0–53)
AST: 16 U/L (ref 0–37)
Albumin: 4.3 g/dL (ref 3.5–5.2)
Alkaline Phosphatase: 68 U/L (ref 39–117)
Bilirubin, Direct: 0.2 mg/dL (ref 0.0–0.3)
Total Bilirubin: 0.8 mg/dL (ref 0.2–1.2)
Total Protein: 6.6 g/dL (ref 6.0–8.3)

## 2021-05-09 LAB — LIPID PANEL
Cholesterol: 93 mg/dL (ref 0–200)
HDL: 30.5 mg/dL — ABNORMAL LOW (ref >39.00)
LDL Cholesterol: 47 mg/dL (ref 0–99)
NonHDL: 62.74
Total CHOL/HDL Ratio: 3
Triglycerides: 79 mg/dL (ref 0.0–149.0)
VLDL: 15.8 mg/dL (ref 0.0–40.0)

## 2021-05-09 LAB — SARS-COV-2 IGG: SARS-COV-2 IgG: 4.76

## 2021-05-09 LAB — VITAMIN D 25 HYDROXY (VIT D DEFICIENCY, FRACTURES): VITD: 29.96 ng/mL — ABNORMAL LOW (ref 30.00–100.00)

## 2021-05-09 LAB — PSA: PSA: 0.35 ng/mL (ref 0.10–4.00)

## 2021-05-09 MED ORDER — OLMESARTAN MEDOXOMIL 20 MG PO TABS: 20.0000 mg | ORAL_TABLET | Freq: Every day | ORAL | 1 refills | Status: DC

## 2021-05-09 NOTE — Progress Notes (Signed)
iarrhea  Subjective:  Patient ID: Jeffery Walker, male    DOB: November 14, 1970  Age: 50 y.o. MRN: [id number]  CC: Annual Exam, Hypertension, Hyperlipidemia, Cough, COPD, and Coronary Artery Disease  This visit occurred during the SARS-CoV-2 public health emergency.  Safety protocols were in place, including screening questions prior to the visit, additional usage of staff PPE, and extensive cleaning of exam room while observing appropriate contact time as indicated for disinfecting solutions.    HPI [name] presents for a CPX and to establish.  He saw his cardiologist about 4 months ago.  He complains of chronic cough that is intermittently productive of yellow phlegm.  He denies chest pain, diaphoresis, fever, chills, night sweats, or hemoptysis.  He also complains of a 2-week history of diarrhea.  He is having about 6 loose bowel movements a day.  He denies abdominal pain, nausea, vomiting, bright red blood per rectum, or melena.  He has not taken anything for the diarrhea.  History [place] has a past medical history of Cataract, GERD (gastroesophageal reflux disease), Hypertension, and Sleep apnea.   He has a past surgical history that includes Cataract extraction (2012); Dental restoration/extraction with x-ray; LEFT HEART CATH AND CORONARY ANGIOGRAPHY (N/A, 04/01/2019); and CORONARY STENT INTERVENTION (N/A, 04/01/2019).   His family history includes Diabetes in his mother; Heart disease in his father.He reports that he has been smoking. He has never used smokeless tobacco. He reports current alcohol use of about 3.0 - 4.0 standard drinks of alcohol per week. He reports that he does not use drugs.  Outpatient Medications Prior to Visit  Medication Sig Dispense Refill   acetaminophen (TYLENOL) 500 MG tablet Take 500 mg by mouth every 6 (six) hours as needed.     aspirin 81 MG EC tablet TAKE 1 TABLET BY MOUTH EVERY DAY 90 tablet 1   atorvastatin (LIPITOR) 80 MG tablet TAKE 1 TABLET BY MOUTH  EVERY DAY 90 tablet 2   cetirizine (ZYRTEC) 10 MG tablet Take 10 mg by mouth daily as needed for allergies.     CHANTIX CONTINUING MONTH PAK 1 MG tablet Take 1 mg by mouth as directed.     clopidogrel (PLAVIX) 75 MG tablet TAKE 1 TABLET BY MOUTH EVERY DAY 90 tablet 1   isosorbide mononitrate (IMDUR) 30 MG 24 hr tablet Take 30 mg by mouth daily.     MELATONIN PO Take by mouth.     metoprolol succinate (TOPROL-XL) 25 MG 24 hr tablet Take 25 mg by mouth daily.      nicotine (NICODERM CQ - DOSED IN MG/24 HOURS) 14 mg/24hr patch 14 mg every morning.     nitroGLYCERIN (NITROSTAT) 0.4 MG SL tablet Place 1 tablet (0.4 mg total) under the tongue every 5 (five) minutes as needed for chest pain. 25 tablet 6   pantoprazole (PROTONIX) 40 MG tablet TAKE 1 TABLET BY MOUTH EVERY DAY 90 tablet 2   Probiotic Product (PROBIOTIC PO) Take by mouth.     lisinopril (ZESTRIL) 10 MG tablet TAKE 1 TABLET BY MOUTH EVERY DAY 90 tablet 3   No facility-administered medications prior to visit.    ROS Review of Systems  Constitutional:  Negative for appetite change, chills, diaphoresis, fatigue, fever and unexpected weight change.  HENT: Negative.    Eyes: Negative.   Respiratory:  Positive for cough. Negative for chest tightness, shortness of breath and wheezing.   Cardiovascular:  Negative for chest pain, palpitations and leg swelling.  Gastrointestinal:  Positive for diarrhea.  Negative for abdominal pain, anal bleeding, blood in stool, nausea and vomiting.  Endocrine: Negative.   Genitourinary: Negative.  Negative for difficulty urinating, dysuria, penile swelling and scrotal swelling.  Musculoskeletal: Negative.  Negative for myalgias.  Skin: Negative.  Negative for color change and rash.  Neurological: Negative.   Hematological: Negative.   Psychiatric/Behavioral: Negative.     Objective:  BP 136/84 (BP Location: Left Arm, Patient Position: Sitting, Cuff Size: Large)   Pulse 70   Temp 98.9 F (37.2 C)  (Oral)   Resp 16   Ht 5\' 9"  (1.753 m)   Wt 222 lb (100.7 kg)   SpO2 99%   BMI 32.78 kg/m   Physical Exam Vitals reviewed. Exam conducted with a chaperone present.  Constitutional:      Appearance: Normal appearance. He is not ill-appearing.  HENT:     Nose: Nose normal.     Mouth/Throat:     Mouth: Mucous membranes are moist.  Eyes:     General: No scleral icterus.    Conjunctiva/sclera: Conjunctivae normal.  Cardiovascular:     Rate and Rhythm: Normal rate and regular rhythm. Occasional Extrasystoles are present.    Heart sounds: Normal heart sounds, S1 normal and S2 normal. No murmur heard.    Comments: EKG- SR with occasional PVC Infarct pattern in inf/post/lateral leads is not new No new changes  Pulmonary:     Effort: Pulmonary effort is normal.     Breath sounds: No stridor. No wheezing, rhonchi or rales.  Abdominal:     General: Abdomen is flat.     Palpations: There is no mass.     Tenderness: There is no abdominal tenderness. There is no guarding.     Hernia: No hernia is present. There is no hernia in the left inguinal area or right inguinal area.  Genitourinary:    Pubic Area: No rash.      Penis: Normal and circumcised.      Testes: Normal.     Epididymis:     Right: Normal.     Left: Normal.     Prostate: Normal. Not enlarged, not tender and no nodules present.     Rectum: Normal. Guaiac result negative. No mass, tenderness, anal fissure, external hemorrhoid or internal hemorrhoid. Normal anal tone.  Musculoskeletal:        General: Normal range of motion.     Cervical back: Neck supple.     Right lower leg: No edema.     Left lower leg: No edema.  Lymphadenopathy:     Cervical: No cervical adenopathy.     Lower Body: No right inguinal adenopathy. No left inguinal adenopathy.  Skin:    General: Skin is warm and dry.  Neurological:     General: No focal deficit present.     Mental Status: He is alert.  Psychiatric:        Mood and Affect: Mood  normal.        Behavior: Behavior normal.    Lab Results  Component Value Date   WBC 8.0 05/09/2021   HGB 14.8 05/09/2021   HCT 42.9 05/09/2021   PLT 254.0 05/09/2021   GLUCOSE 76 05/09/2021   CHOL 93 05/09/2021   TRIG 79.0 05/09/2021   HDL 30.50 (L) 05/09/2021   LDLCALC 47 05/09/2021   ALT 18 05/09/2021   AST 16 05/09/2021   NA 135 05/09/2021   K 4.7 05/09/2021   CL 101 05/09/2021   CREATININE 1.07 05/09/2021   BUN  12 05/09/2021   CO2 28 05/09/2021   TSH 1.760 07/01/2019   PSA 0.35 05/09/2021    DG Chest 2 View  Result Date: 05/10/2021 CLINICAL DATA:  Productive cough EXAM: CHEST - 2 VIEW COMPARISON:  03/29/2019 FINDINGS: The heart size and mediastinal contours are within normal limits. No focal airspace consolidation, pleural effusion, or pneumothorax. The visualized skeletal structures are unremarkable. IMPRESSION: No active cardiopulmonary disease. Electronically Signed   By: Davina Poke D.O.   On: 05/10/2021 10:45     Assessment & Plan:   V[place] was seen today for annual exam, hypertension, hyperlipidemia, cough, copd and coronary artery disease.  Diagnoses and all orders for this visit:  Diarrhea of presumed infectious origin- His COVID antibody test is positive.  His other labs are reassuring.  I have asked him to submit stool specimens to screen for infection. -     Clostridium difficile EIA; Future -     Fecal lactoferrin, quant; Future -     Ova and parasite examination; Future -     SARS-COV-2 IgG; Future -     Thyroid Panel With TSH; Future -     Thyroid Panel With TSH -     SARS-COV-2 IgG -     Ova and parasite examination -     Fecal lactoferrin, quant -     Clostridium difficile EIA  Encounter for general adult medical examination with abnormal findings-exam completed, labs reviewed, vaccines reviewed, cancer screenings are up-to-date, patient education was given. -     PSA; Future -     Lipid panel; Future -     Hepatitis C antibody; Future -      HIV Antibody (routine testing w rflx); Future -     HIV Antibody (routine testing w rflx) -     Hepatitis C antibody -     Lipid panel -     PSA  Hyperlipidemia LDL goal <70- LDL goal achieved. Doing well on the statin  -     Thyroid Panel With TSH; Future -     Hepatic function panel; Future -     Hepatic function panel -     Thyroid Panel With TSH  Primary hypertension- His blood pressure is adequately well controlled but he has a chronic cough.  I recommended that he discontinue the ACE inhibitor and start taking an ARB. -     Basic metabolic panel; Future -     CBC with Differential/Platelet; Future -     Thyroid Panel With TSH; Future -     Urinalysis, Routine w reflex microscopic; Future -     VITAMIN D 25 Hydroxy (Vit-D Deficiency, Fractures); Future -     VITAMIN D 25 Hydroxy (Vit-D Deficiency, Fractures) -     Urinalysis, Routine w reflex microscopic -     Thyroid Panel With TSH -     CBC with Differential/Platelet -     Basic metabolic panel  Need for hepatitis C screening test -     Hepatitis C antibody; Future -     Hepatitis C antibody  Tobacco abuse -     Ambulatory Referral for Lung Cancer Scre  Cough- His chest x-ray is negative for mass or infiltrate.  Will discontinue the ACE inhibitor.  I have asked him to consider start using a LAMA inhaler for symptom relief. -     DG Chest 2 View; Future  Coronary artery disease involving native coronary artery of native heart without angina pectoris-  There are no signs and symptoms of angina. -     olmesartan (BENICAR) 20 MG tablet; Take 1 tablet (20 mg total) by mouth daily.  Essential hypertension -     olmesartan (BENICAR) 20 MG tablet; Take 1 tablet (20 mg total) by mouth daily. -     EKG 12-Lead  Mucopurulent chronic bronchitis ([place])- See above.  Vitamin D insufficiency -     Cholecalciferol 50 MCG (2000 UT) TABS; Take 1 tablet (2,000 Units total) by mouth daily.  Gastroenteritis due to COVID-19 virus- His  symptoms are mild and needs too late to consider treating this.  I have discontinued Jori Moll A. Branton's lisinopril. I am also having him start on olmesartan and Cholecalciferol. Additionally, I am having him maintain his metoprolol succinate, isosorbide mononitrate, cetirizine, acetaminophen, nicotine, Chantix Continuing Month Pak, nitroGLYCERIN, clopidogrel, atorvastatin, pantoprazole, aspirin, MELATONIN PO, and Probiotic Product (PROBIOTIC PO).  Meds ordered this encounter  Medications   olmesartan (BENICAR) 20 MG tablet    Sig: Take 1 tablet (20 mg total) by mouth daily.    Dispense:  90 tablet    Refill:  1   Cholecalciferol 50 MCG (2000 UT) TABS    Sig: Take 1 tablet (2,000 Units total) by mouth daily.    Dispense:  90 tablet    Refill:  1   In addition to time spent on CPE, I spent 50 minutes in preparing to see the patient by review of recent labs, obtaining and reviewing separately obtained history, communicating with the patient, ordering medications, labs and an EKG, and documenting clinical information in the EHR including the differential Dx, treatment, and any further evaluation and other management of 1. Diarrhea of presumed infectious origin 2. Hyperlipidemia LDL goal <70 3. Primary hypertension 4. Cough 5. Coronary artery disease involving native coronary artery of native heart without angina pectoris 6. Essential hypertension 7. Mucopurulent chronic bronchitis ([place]) 8. Vitamin D insufficiency 9. Gastroenteritis due to COVID-19 virus       Follow-up: Return in about 6 months (around 11/09/2021).  Scarlette Calico, MD

## 2021-05-09 NOTE — Patient Instructions (Signed)
Health Maintenance, Male Adopting a healthy lifestyle and getting preventive care are important in promoting health and wellness. Ask your health care provider about: The right schedule for you to have regular tests and exams. Things you can do on your own to prevent diseases and keep yourself healthy. What should I know about diet, weight, and exercise? Eat a healthy diet  Eat a diet that includes plenty of vegetables, fruits, low-fat dairy products, and lean protein. Do not eat a lot of foods that are high in solid fats, added sugars, or sodium.  Maintain a healthy weight Body mass index (BMI) is a measurement that can be used to identify possible weight problems. It estimates body fat based on height and weight. Your health care provider can help determine your BMI and help you achieve or maintain ahealthy weight. Get regular exercise Get regular exercise. This is one of the most important things you can do for your health. Most adults should: Exercise for at least 150 minutes each week. The exercise should increase your heart rate and make you sweat (moderate-intensity exercise). Do strengthening exercises at least twice a week. This is in addition to the moderate-intensity exercise. Spend less time sitting. Even light physical activity can be beneficial. Watch cholesterol and blood lipids Have your blood tested for lipids and cholesterol at 51 years of age, then havethis test every 5 years. You may need to have your cholesterol levels checked more often if: Your lipid or cholesterol levels are high. You are older than 51 years of age. You are at high risk for heart disease. What should I know about cancer screening? Many types of cancers can be detected early and may often be prevented. Depending on your health history and family history, you may need to have cancer screening at various ages. This may include screening for: Colorectal cancer. Prostate cancer. Skin cancer. Lung  cancer. What should I know about heart disease, diabetes, and high blood pressure? Blood pressure and heart disease High blood pressure causes heart disease and increases the risk of stroke. This is more likely to develop in people who have high blood pressure readings, are of African descent, or are overweight. Talk with your health care provider about your target blood pressure readings. Have your blood pressure checked: Every 3-5 years if you are 18-39 years of age. Every year if you are 40 years old or older. If you are between the ages of 65 and 75 and are a current or former smoker, ask your health care provider if you should have a one-time screening for abdominal aortic aneurysm (AAA). Diabetes Have regular diabetes screenings. This checks your fasting blood sugar level. Have the screening done: Once every three years after age 45 if you are at a normal weight and have a low risk for diabetes. More often and at a younger age if you are overweight or have a high risk for diabetes. What should I know about preventing infection? Hepatitis B If you have a higher risk for hepatitis B, you should be screened for this virus. Talk with your health care provider to find out if you are at risk forhepatitis B infection. Hepatitis C Blood testing is recommended for: Everyone born from 1945 through 1965. Anyone with known risk factors for hepatitis C. Sexually transmitted infections (STIs) You should be screened each year for STIs, including gonorrhea and chlamydia, if: You are sexually active and are younger than 51 years of age. You are older than 51 years of age   and your health care provider tells you that you are at risk for this type of infection. Your sexual activity has changed since you were last screened, and you are at increased risk for chlamydia or gonorrhea. Ask your health care provider if you are at risk. Ask your health care provider about whether you are at high risk for HIV.  Your health care provider may recommend a prescription medicine to help prevent HIV infection. If you choose to take medicine to prevent HIV, you should first get tested for HIV. You should then be tested every 3 months for as long as you are taking the medicine. Follow these instructions at home: Lifestyle Do not use any products that contain nicotine or tobacco, such as cigarettes, e-cigarettes, and chewing tobacco. If you need help quitting, ask your health care provider. Do not use street drugs. Do not share needles. Ask your health care provider for help if you need support or information about quitting drugs. Alcohol use Do not drink alcohol if your health care provider tells you not to drink. If you drink alcohol: Limit how much you have to 0-2 drinks a day. Be aware of how much alcohol is in your drink. In the [place] one drink equals one 12 oz bottle of beer (355 mL), one 5 oz glass of wine (148 mL), or one 1 oz glass of hard liquor (44 mL). General instructions Schedule regular health, dental, and eye exams. Stay current with your vaccines. Tell your health care provider if: You often feel depressed. You have ever been abused or do not feel safe at home. Summary Adopting a healthy lifestyle and getting preventive care are important in promoting health and wellness. Follow your health care provider's instructions about healthy diet, exercising, and getting tested or screened for diseases. Follow your health care provider's instructions on monitoring your cholesterol and blood pressure. This information is not intended to replace advice given to you by your health care provider. Make sure you discuss any questions you have with your healthcare provider. Document Revised: 10/13/2018 Document Reviewed: 10/13/2018 Elsevier Patient Education  2022 [employer]  

## 2021-05-10 ENCOUNTER — Ambulatory Visit (INDEPENDENT_AMBULATORY_CARE_PROVIDER_SITE_OTHER): Payer: 59

## 2021-05-10 DIAGNOSIS — J411 Mucopurulent chronic bronchitis: Secondary | ICD-10-CM | POA: Insufficient documentation

## 2021-05-10 DIAGNOSIS — U071 COVID-19: Secondary | ICD-10-CM | POA: Insufficient documentation

## 2021-05-10 DIAGNOSIS — R059 Cough, unspecified: Secondary | ICD-10-CM

## 2021-05-10 DIAGNOSIS — E559 Vitamin D deficiency, unspecified: Secondary | ICD-10-CM | POA: Insufficient documentation

## 2021-05-10 DIAGNOSIS — A0839 Other viral enteritis: Secondary | ICD-10-CM | POA: Insufficient documentation

## 2021-05-10 LAB — THYROID PANEL WITH TSH
Free Thyroxine Index: 1.9 (ref 1.4–3.8)
T3 Uptake: 28 % (ref 22–35)
T4, Total: 6.9 ug/dL (ref 4.9–10.5)
TSH: 1.18 mIU/L (ref 0.40–4.50)

## 2021-05-10 LAB — HEPATITIS C ANTIBODY
Hepatitis C Ab: NONREACTIVE
SIGNAL TO CUT-OFF: 0 (ref <1.00)

## 2021-05-10 LAB — HIV ANTIBODY (ROUTINE TESTING W REFLEX): HIV 1&2 Ab, 4th Generation: NONREACTIVE

## 2021-05-10 MED ORDER — CHOLECALCIFEROL 50 MCG (2000 UT) PO TABS: 1.0000 | ORAL_TABLET | Freq: Every day | ORAL | 1 refills | Status: DC

## 2021-05-13 LAB — CLOSTRIDIUM DIFFICILE EIA: C difficile Toxins A+B, EIA: NEGATIVE

## 2021-05-14 LAB — FECAL LACTOFERRIN, QUANT: LACTOFERRIN, QL, STOOL: NEGATIVE

## 2021-05-14 LAB — OVA AND PARASITE EXAMINATION: CONCENTRATE RESULT:: NONE SEEN

## 2021-05-15 ENCOUNTER — Encounter: Payer: Self-pay | Admitting: Internal Medicine

## 2021-05-15 ENCOUNTER — Other Ambulatory Visit: Payer: Self-pay | Admitting: Internal Medicine

## 2021-05-15 DIAGNOSIS — A0839 Other viral enteritis: Secondary | ICD-10-CM

## 2021-05-15 MED ORDER — DIPHENOXYLATE-ATROPINE 2.5-0.025 MG PO TABS: 1.0000 | ORAL_TABLET | Freq: Two times a day (BID) | ORAL | 0 refills | Status: DC | PRN

## 2021-05-22 ENCOUNTER — Other Ambulatory Visit: Payer: Self-pay | Admitting: Internal Medicine

## 2021-05-22 DIAGNOSIS — R197 Diarrhea, unspecified: Secondary | ICD-10-CM | POA: Insufficient documentation

## 2021-08-09 ENCOUNTER — Encounter: Payer: Self-pay | Admitting: Gastroenterology

## 2021-09-09 ENCOUNTER — Encounter: Payer: Self-pay | Admitting: Gastroenterology

## 2021-09-09 ENCOUNTER — Other Ambulatory Visit (INDEPENDENT_AMBULATORY_CARE_PROVIDER_SITE_OTHER): Payer: 59

## 2021-09-09 ENCOUNTER — Ambulatory Visit: Payer: 59 | Admitting: Gastroenterology

## 2021-09-09 VITALS — BP 120/84 | HR 68 | Ht 69.0 in | Wt 224.0 lb

## 2021-09-09 DIAGNOSIS — R194 Change in bowel habit: Secondary | ICD-10-CM

## 2021-09-09 LAB — COMPREHENSIVE METABOLIC PANEL
ALT: 20 U/L (ref 0–53)
AST: 17 U/L (ref 0–37)
Albumin: 4.4 g/dL (ref 3.5–5.2)
Alkaline Phosphatase: 61 U/L (ref 39–117)
BUN: 11 mg/dL (ref 6–23)
CO2: 28 mEq/L (ref 19–32)
Calcium: 9.1 mg/dL (ref 8.4–10.5)
Chloride: 103 mEq/L (ref 96–112)
Creatinine, Ser: 1.14 mg/dL (ref 0.40–1.50)
GFR: 74.44 mL/min (ref >60.00)
Glucose, Bld: 87 mg/dL (ref 70–99)
Potassium: 4.8 mEq/L (ref 3.5–5.1)
Sodium: 137 mEq/L (ref 135–145)
Total Bilirubin: 0.9 mg/dL (ref 0.2–1.2)
Total Protein: 7.1 g/dL (ref 6.0–8.3)

## 2021-09-09 LAB — CBC
HCT: 46.3 % (ref 39.0–52.0)
Hemoglobin: 15.7 g/dL (ref 13.0–17.0)
MCHC: 33.9 g/dL (ref 30.0–36.0)
MCV: 95.1 fl (ref 78.0–100.0)
Platelets: 242 10*3/uL (ref 150.0–400.0)
RBC: 4.87 Mil/uL (ref 4.22–5.81)
RDW: 13.6 % (ref 11.5–15.5)
WBC: 7.8 10*3/uL (ref 4.0–10.5)

## 2021-09-09 LAB — TSH: TSH: 1.49 u[IU]/mL (ref 0.35–5.50)

## 2021-09-09 LAB — SEDIMENTATION RATE: Sed Rate: 3 mm/hr (ref 0–20)

## 2021-09-09 LAB — C-REACTIVE PROTEIN: CRP: <1 mg/dL (ref 0.5–20.0)

## 2021-09-09 NOTE — Progress Notes (Signed)
Review of pertinent gastrointestinal problems: 1.  Diarrhea of unclear etiology 2018, elevated sed rate.  Eventual colonoscopy May 2018 suggested inflammation in his terminal ileum and also in his right colon.  These areas were biopsied extensively.  Pathology showed no active or chronic inflammation anywhere in his colon or small bowel.  HPI: This is a very pleasant 50 year old man  Stool test July 2022 C. difficile negative by toxin AMB., fecal lactoferrin negative, ova parasites negative.  Blood work July 2022 comprehensive metabolic profile was normal, CBC was normal,  I last saw him here about 4 and half years ago.  That was at the time of a colonoscopy.  See that results summarized above.  He did quite well from a bowel perspective until this past May when he noticed increased frequency.  He started going 4-5 times a day with some mild urgency.  Stools are soft but formed.  Never bloody.  Prior to this change he was having 1-2 bowel movements daily.  A single Imodium will generally help but will cause him to have some abdominal cramping and discomfort afterwards.  He drinks 4-6 beers daily.  His weight is overall stable.  He has no significant nausea, vomiting.  Review of systems: Pertinent positive and negative review of systems were noted in the above HPI section. All other review negative.   Past Medical History:  Diagnosis Date   Cataract    GERD (gastroesophageal reflux disease)    Hypertension    Sleep apnea     Past Surgical History:  Procedure Laterality Date   CATARACT EXTRACTION  2012   right eye   CORONARY STENT INTERVENTION N/A 04/01/2019   Procedure: CORONARY STENT INTERVENTION;  Surgeon: Leonie Man, MD;  Location: [facility];  Service: Cardiovascular;  Laterality: N/A;   DENTAL RESTORATION/EXTRACTION WITH X-RAY     LEFT HEART CATH AND CORONARY ANGIOGRAPHY N/A 04/01/2019   Procedure: LEFT HEART CATH AND CORONARY ANGIOGRAPHY;  Surgeon: Leonie Man, MD;  Location: [facility];  Service: Cardiovascular;  Laterality: N/A;    Current Outpatient Medications  Medication Sig Dispense Refill   acetaminophen (TYLENOL) 500 MG tablet Take 500 mg by mouth every 6 (six) hours as needed.     aspirin 81 MG EC tablet TAKE 1 TABLET BY MOUTH EVERY DAY 90 tablet 1   atorvastatin (LIPITOR) 80 MG tablet TAKE 1 TABLET BY MOUTH EVERY DAY 90 tablet 2   cetirizine (ZYRTEC) 10 MG tablet Take 10 mg by mouth daily as needed for allergies.     Cholecalciferol 50 MCG (2000 UT) TABS Take 1 tablet (2,000 Units total) by mouth daily. 90 tablet 1   clopidogrel (PLAVIX) 75 MG tablet TAKE 1 TABLET BY MOUTH EVERY DAY 90 tablet 1   diphenoxylate-atropine (LOMOTIL) 2.5-0.025 MG tablet Take 1 tablet by mouth 2 (two) times daily as needed for diarrhea or loose stools. 60 tablet 0   isosorbide mononitrate (IMDUR) 30 MG 24 hr tablet Take 30 mg by mouth daily.     MELATONIN PO Take by mouth.     metoprolol succinate (TOPROL-XL) 25 MG 24 hr tablet Take 25 mg by mouth daily.      nicotine (NICODERM CQ - DOSED IN MG/24 HOURS) 14 mg/24hr patch 14 mg every morning.     Omega-3 Fatty Acids (FISH OIL) 1200 MG CAPS Take by mouth.     pantoprazole (PROTONIX) 40 MG tablet TAKE 1 TABLET BY MOUTH EVERY DAY 90 tablet 2  No current facility-administered medications for this visit.    Allergies as of 09/09/2021 - Review Complete 09/09/2021  Allergen Reaction Noted   Lisinopril Cough 05/09/2021    Family History  Problem Relation Age of Onset   Diabetes Mother    Heart disease Father    Colon cancer Neg Hx     Social History   Socioeconomic History   Marital status: Married    Spouse name: Not on file   Number of children: 1   Years of education: Not on file   Highest education level: Not on file  Occupational History   Not on file  Tobacco Use   Smoking status: Every Day   Smokeless tobacco: Never  Vaping Use   Vaping Use: Never used  Substance and  Sexual Activity   Alcohol use: Yes    Alcohol/week: 3.0 - 4.0 standard drinks    Types: 3 - 4 Cans of beer per week   Drug use: No   Sexual activity: Not on file  Other Topics Concern   Not on file  Social History Narrative   Not on file   Social Determinants of Health   Financial Resource Strain: Not on file  Food Insecurity: Not on file  Transportation Needs: Not on file  Physical Activity: Not on file  Stress: Not on file  Social Connections: Not on file  Intimate Partner Violence: Not on file     Physical Exam: BP 120/84   Pulse 68   Ht 5\' 9"  (1.753 m)   Wt 224 lb (101.6 kg)   BMI 33.08 kg/m  Constitutional: generally well-appearing Psychiatric: alert and oriented x3 Eyes: extraocular movements intact Mouth: oral pharynx moist, no lesions Neck: supple no lymphadenopathy Cardiovascular: heart regular rate and rhythm Lungs: clear to auscultation bilaterally Abdomen: soft, nontender, nondistended, no obvious ascites, no peritoneal signs, normal bowel sounds Extremities: no lower extremity edema bilaterally Skin: no lesions on visible extremities   Assessment and plan: 50 y.o. male with change in bowel habits  He is going more frequent with some urgency but his stools remain formed.  Unclear etiology.  His colon and terminal ileum looked a bit inflamed endoscopically for 5 years ago however pathology did not show any evidence of active or chronic inflammation 4-6 beers a day could certainly contribute to generalized loose stools like his.  I recommended we proceed with testing including stool test and blood work.  This will include CBC, complete metabolic profile, thyroid testing, sedimentation rate, CRP, celiac sprue serologies, GI pathogen panel, pancreatic elastase.  If these do not provide clear etiology of his issues he understands and I recommend further testing with repeat colonoscopy especially given discordant data and his previous colonoscopy.  In the meantime  he will stop taking Lomotil and instead he will start taking a single over-the-counter Imodium once daily.   Please see the "Patient Instructions" section for addition details about the plan.   Owens Loffler, MD [facility] 09/09/2021, 11:20 AM  Cc: Janith Lima, MD  Total time on date of encounter was 35 minutes (this included time spent preparing to see the patient reviewing records; obtaining and/or reviewing separately obtained history; performing a medically appropriate exam and/or evaluation; counseling and educating the patient and family if present; ordering medications, tests or procedures if applicable; and documenting clinical information in the health record).

## 2021-09-09 NOTE — Patient Instructions (Signed)
If you are age 51 or younger, your body mass index should be between 19-25. Your Body mass index is 33.08 kg/m. If this is out of the aformentioned range listed, please consider follow up with your Primary Care Provider.  ________________________________________________________  The [facility] providers would like to encourage you to use [facility] to communicate with providers for non-urgent requests or questions.  Due to long hold times on the telephone, sending your provider a message by [facility] may be a faster and more efficient way to get a response.  Please allow 48 business hours for a response.  Please remember that this is for non-urgent requests.  _______________________________________________________  Your provider has requested that you go to the basement level for lab work before leaving today. Press "B" on the elevator. The lab is located at the first door on the left as you exit the elevator.  Due to recent changes in healthcare laws, you may see the results of your imaging and laboratory studies on MyChart before your provider has had a chance to review them.  We understand that in some cases there may be results that are confusing or concerning to you. Not all laboratory results come back in the same time frame and the provider may be waiting for multiple results in order to interpret others.  Please give [place] 48 hours in order for your provider to thoroughly review all the results before contacting the office for clarification of your results.   Thank you for entrusting me with your care and choosing [facility].  Dr Ardis Hughs

## 2021-09-10 LAB — IGA: Immunoglobulin A: 141 mg/dL (ref 47–310)

## 2021-09-10 LAB — TISSUE TRANSGLUTAMINASE, IGA: (tTG) Ab, IgA: <1 U/mL

## 2021-11-09 ENCOUNTER — Other Ambulatory Visit: Payer: Self-pay | Admitting: Internal Medicine

## 2021-11-09 DIAGNOSIS — I10 Essential (primary) hypertension: Secondary | ICD-10-CM

## 2021-11-09 DIAGNOSIS — I2510 Atherosclerotic heart disease of native coronary artery without angina pectoris: Secondary | ICD-10-CM

## 2021-11-21 ENCOUNTER — Other Ambulatory Visit: Payer: Self-pay | Admitting: Internal Medicine

## 2021-11-22 ENCOUNTER — Other Ambulatory Visit: Payer: Self-pay | Admitting: Internal Medicine

## 2021-12-06 ENCOUNTER — Other Ambulatory Visit: Payer: Self-pay | Admitting: Internal Medicine

## 2021-12-06 DIAGNOSIS — I10 Essential (primary) hypertension: Secondary | ICD-10-CM

## 2021-12-06 DIAGNOSIS — I2510 Atherosclerotic heart disease of native coronary artery without angina pectoris: Secondary | ICD-10-CM

## 2022-05-21 ENCOUNTER — Other Ambulatory Visit: Payer: Self-pay | Admitting: Internal Medicine

## 2022-05-21 DIAGNOSIS — I2510 Atherosclerotic heart disease of native coronary artery without angina pectoris: Secondary | ICD-10-CM

## 2022-05-21 DIAGNOSIS — I10 Essential (primary) hypertension: Secondary | ICD-10-CM

## 2022-06-01 ENCOUNTER — Encounter: Payer: Self-pay | Admitting: Internal Medicine

## 2022-06-02 ENCOUNTER — Other Ambulatory Visit: Payer: Self-pay | Admitting: Internal Medicine

## 2022-06-11 ENCOUNTER — Ambulatory Visit: Payer: 59 | Admitting: Internal Medicine

## 2022-06-11 ENCOUNTER — Encounter: Payer: Self-pay | Admitting: Internal Medicine

## 2022-06-11 VITALS — BP 162/76 | HR 62 | Temp 98.3°F | Resp 16 | Ht 69.0 in | Wt 225.5 lb

## 2022-06-11 DIAGNOSIS — Z23 Encounter for immunization: Secondary | ICD-10-CM

## 2022-06-11 DIAGNOSIS — I10 Essential (primary) hypertension: Secondary | ICD-10-CM

## 2022-06-11 DIAGNOSIS — Z720 Tobacco use: Secondary | ICD-10-CM | POA: Diagnosis not present

## 2022-06-11 DIAGNOSIS — E785 Hyperlipidemia, unspecified: Secondary | ICD-10-CM | POA: Diagnosis not present

## 2022-06-11 DIAGNOSIS — I2510 Atherosclerotic heart disease of native coronary artery without angina pectoris: Secondary | ICD-10-CM | POA: Diagnosis not present

## 2022-06-11 DIAGNOSIS — R81 Glycosuria: Secondary | ICD-10-CM

## 2022-06-11 DIAGNOSIS — R1310 Dysphagia, unspecified: Secondary | ICD-10-CM

## 2022-06-11 DIAGNOSIS — K2100 Gastro-esophageal reflux disease with esophagitis, without bleeding: Secondary | ICD-10-CM | POA: Diagnosis not present

## 2022-06-11 DIAGNOSIS — Z0001 Encounter for general adult medical examination with abnormal findings: Secondary | ICD-10-CM

## 2022-06-11 DIAGNOSIS — E559 Vitamin D deficiency, unspecified: Secondary | ICD-10-CM | POA: Diagnosis not present

## 2022-06-11 LAB — LIPID PANEL
Cholesterol: 96 mg/dL (ref 0–200)
HDL: 30.8 mg/dL — ABNORMAL LOW (ref >39.00)
LDL Cholesterol: 48 mg/dL (ref 0–99)
NonHDL: 65.1
Total CHOL/HDL Ratio: 3
Triglycerides: 85 mg/dL (ref 0.0–149.0)
VLDL: 17 mg/dL (ref 0.0–40.0)

## 2022-06-11 LAB — BASIC METABOLIC PANEL
BUN: 21 mg/dL (ref 6–23)
CO2: 26 mEq/L (ref 19–32)
Calcium: 9.5 mg/dL (ref 8.4–10.5)
Chloride: 102 mEq/L (ref 96–112)
Creatinine, Ser: 1.1 mg/dL (ref 0.40–1.50)
GFR: 77.29 mL/min (ref >60.00)
Glucose, Bld: 79 mg/dL (ref 70–99)
Potassium: 4.2 mEq/L (ref 3.5–5.1)
Sodium: 136 mEq/L (ref 135–145)

## 2022-06-11 LAB — HEPATIC FUNCTION PANEL
ALT: 24 U/L (ref 0–53)
AST: 20 U/L (ref 0–37)
Albumin: 4.4 g/dL (ref 3.5–5.2)
Alkaline Phosphatase: 52 U/L (ref 39–117)
Bilirubin, Direct: 0.1 mg/dL (ref 0.0–0.3)
Total Bilirubin: 0.6 mg/dL (ref 0.2–1.2)
Total Protein: 6.7 g/dL (ref 6.0–8.3)

## 2022-06-11 LAB — CBC WITH DIFFERENTIAL/PLATELET
Basophils Absolute: 0.1 10*3/uL (ref 0.0–0.1)
Basophils Relative: 0.6 % (ref 0.0–3.0)
Eosinophils Absolute: 0.3 10*3/uL (ref 0.0–0.7)
Eosinophils Relative: 3 % (ref 0.0–5.0)
HCT: 43.9 % (ref 39.0–52.0)
Hemoglobin: 14.9 g/dL (ref 13.0–17.0)
Lymphocytes Relative: 37.7 % (ref 12.0–46.0)
Lymphs Abs: 3.4 10*3/uL (ref 0.7–4.0)
MCHC: 33.9 g/dL (ref 30.0–36.0)
MCV: 97.3 fl (ref 78.0–100.0)
Monocytes Absolute: 0.9 10*3/uL (ref 0.1–1.0)
Monocytes Relative: 9.8 % (ref 3.0–12.0)
Neutro Abs: 4.4 10*3/uL (ref 1.4–7.7)
Neutrophils Relative %: 48.9 % (ref 43.0–77.0)
Platelets: 234 10*3/uL (ref 150.0–400.0)
RBC: 4.51 Mil/uL (ref 4.22–5.81)
RDW: 13.6 % (ref 11.5–15.5)
WBC: 9 10*3/uL (ref 4.0–10.5)

## 2022-06-11 LAB — VITAMIN D 25 HYDROXY (VIT D DEFICIENCY, FRACTURES): VITD: 92.9 ng/mL (ref 30.00–100.00)

## 2022-06-11 LAB — PSA: PSA: 0.31 ng/mL (ref 0.10–4.00)

## 2022-06-11 LAB — TSH: TSH: 1.37 u[IU]/mL (ref 0.35–5.50)

## 2022-06-11 MED ORDER — METOPROLOL SUCCINATE ER 25 MG PO TB24: 25.0000 mg | ORAL_TABLET | Freq: Every day | ORAL | 1 refills | Status: DC

## 2022-06-11 MED ORDER — ASPIRIN 81 MG PO TBEC: 81.0000 mg | DELAYED_RELEASE_TABLET | Freq: Every day | ORAL | 1 refills | Status: DC

## 2022-06-11 MED ORDER — ATORVASTATIN CALCIUM 80 MG PO TABS: 80.0000 mg | ORAL_TABLET | Freq: Every day | ORAL | 1 refills | Status: DC

## 2022-06-11 MED ORDER — OLMESARTAN MEDOXOMIL 20 MG PO TABS: 20.0000 mg | ORAL_TABLET | Freq: Every day | ORAL | 0 refills | Status: DC

## 2022-06-11 MED ORDER — PANTOPRAZOLE SODIUM 40 MG PO TBEC: 40.0000 mg | DELAYED_RELEASE_TABLET | Freq: Every day | ORAL | 1 refills | Status: DC

## 2022-06-11 NOTE — Progress Notes (Signed)
Subjective:  Patient ID: Jeffery Walker, male    DOB: 1971-08-12  Age: 51 y.o. MRN: [id number]  CC: Annual Exam, Gastroesophageal Reflux, Hypertension, and Hyperlipidemia   HPI Jeffery Walker presents for a CPX and f/up - -  He complains of a 53-monthhistory of odynophagia with meds.  He also feels a pressure sensation in the left ear.  He has had mild odynophagia but denies loss of appetite, weight loss, chest pain, shortness of breath, abdominal pain, melena, or bright red blood per rectum.  Outpatient Medications Prior to Visit  Medication Sig Dispense Refill   acetaminophen (TYLENOL) 500 MG tablet Take 500 mg by mouth every 6 (six) hours as needed.     cetirizine (ZYRTEC) 10 MG tablet Take 10 mg by mouth daily as needed for allergies.     Cholecalciferol 50 MCG (2000 UT) TABS Take 1 tablet (2,000 Units total) by mouth daily. 90 tablet 1   isosorbide mononitrate (IMDUR) 30 MG 24 hr tablet Take 30 mg by mouth daily.     nicotine (NICODERM CQ - DOSED IN MG/24 HOURS) 14 mg/24hr patch 14 mg every morning.     Omega-3 Fatty Acids (FISH OIL) 1200 MG CAPS Take by mouth.     aspirin 81 MG EC tablet TAKE 1 TABLET BY MOUTH EVERY DAY 90 tablet 1   atorvastatin (LIPITOR) 80 MG tablet TAKE 1 TABLET BY MOUTH EVERY DAY 90 tablet 2   metoprolol succinate (TOPROL-XL) 25 MG 24 hr tablet Take 25 mg by mouth daily.      pantoprazole (PROTONIX) 40 MG tablet TAKE 1 TABLET BY MOUTH EVERY DAY 90 tablet 2   clopidogrel (PLAVIX) 75 MG tablet TAKE 1 TABLET BY MOUTH EVERY DAY (Patient not taking: Reported on 06/11/2022) 90 tablet 1   MELATONIN PO Take by mouth. (Patient not taking: Reported on 06/11/2022)     No facility-administered medications prior to visit.    ROS Review of Systems  Constitutional:  Negative for chills, diaphoresis, fatigue and fever.  HENT:  Positive for ear pain and trouble swallowing. Negative for sinus pressure, sinus pain, sore throat and voice change.   Eyes: Negative.    Respiratory: Negative.  Negative for cough and shortness of breath.   Cardiovascular:  Negative for chest pain, palpitations and leg swelling.  Gastrointestinal:  Negative for abdominal pain, blood in stool, constipation, nausea and vomiting.  Endocrine: Negative.   Genitourinary: Negative.  Negative for difficulty urinating.  Musculoskeletal: Negative.  Negative for myalgias.  Skin: Negative.   Allergic/Immunologic: Negative.   Neurological: Negative.   Hematological: Negative.   Psychiatric/Behavioral: Negative.      Objective:  BP (!) 162/76 (BP Location: Left Arm, Patient Position: Sitting, Cuff Size: Large)   Pulse 62   Temp 98.3 F (36.8 C) (Oral)   Resp 16   Ht '5\' 9"'$  (1.753 m)   Wt 225 lb 8 oz (102.3 kg)   SpO2 95%   BMI 33.30 kg/m   BP Readings from Last 3 Encounters:  06/11/22 (!) 162/76  09/09/21 120/84  05/09/21 136/84    Wt Readings from Last 3 Encounters:  06/11/22 225 lb 8 oz (102.3 kg)  09/09/21 224 lb (101.6 kg)  05/09/21 222 lb (100.7 kg)    Physical Exam Vitals reviewed. Exam conducted with a chaperone present.  HENT:     Right Ear: Hearing, tympanic membrane, ear canal and external ear normal. No middle ear effusion. Tympanic membrane is not injected.  Left Ear: Hearing, tympanic membrane, ear canal and external ear normal.  No middle ear effusion. Tympanic membrane is not injected.     Nose: Nose normal.     Mouth/Throat:     Mouth: Mucous membranes are moist.  Eyes:     General: No scleral icterus.    Conjunctiva/sclera: Conjunctivae normal.  Cardiovascular:     Rate and Rhythm: Normal rate and regular rhythm.     Heart sounds: No murmur heard.    Comments: EKG- NSR, 61 bpm Posterior-inferior infarct pattern is old Insignificant Q waves in lateral leads No LVH Pulmonary:     Effort: Pulmonary effort is normal.     Breath sounds: No stridor. No wheezing, rhonchi or rales.  Abdominal:     General: Abdomen is flat.     Palpations:  There is no mass.     Tenderness: There is no abdominal tenderness. There is no guarding.     Hernia: No hernia is present. There is no hernia in the left inguinal area or right inguinal area.  Genitourinary:    Pubic Area: No rash.      Penis: Normal and circumcised.      Testes: Normal.     Epididymis:     Right: Normal.     Left: Normal.     Prostate: Normal. Not enlarged, not tender and no nodules present.     Rectum: Normal. Guaiac result negative. No mass, tenderness, anal fissure, external hemorrhoid or internal hemorrhoid. Normal anal tone.  Musculoskeletal:        General: Normal range of motion.     Cervical back: Neck supple.     Right lower leg: No edema.     Left lower leg: No edema.  Lymphadenopathy:     Cervical: No cervical adenopathy.     Lower Body: No right inguinal adenopathy. No left inguinal adenopathy.  Skin:    General: Skin is warm and dry.     Coloration: Skin is not pale.  Neurological:     General: No focal deficit present.     Mental Status: He is alert. Mental status is at baseline.  Psychiatric:        Mood and Affect: Mood normal.        Behavior: Behavior normal.        Thought Content: Thought content normal.        Judgment: Judgment normal.     Lab Results  Component Value Date   WBC 9.0 06/11/2022   HGB 14.9 06/11/2022   HCT 43.9 06/11/2022   PLT 234.0 06/11/2022   GLUCOSE 79 06/11/2022   CHOL 96 06/11/2022   TRIG 85.0 06/11/2022   HDL 30.80 (L) 06/11/2022   LDLCALC 48 06/11/2022   ALT 24 06/11/2022   AST 20 06/11/2022   NA 136 06/11/2022   K 4.2 06/11/2022   CL 102 06/11/2022   CREATININE 1.10 06/11/2022   BUN 21 06/11/2022   CO2 26 06/11/2022   TSH 1.37 06/11/2022   PSA 0.31 06/11/2022    VAS US CAROTID  Result Date: 01/12/2020 Carotid Arterial Duplex Study Indications:       Bilateral bruits and Patient states for the last 3 weeks he                    has intermittently felt tingling in both arms if his blood  pressure is elevated. Otherwise, denies any other                    cerebrovascular symptoms. Risk Factors:      Hypertension, hyperlipidemia, current smoker, prior MI,                    coronary artery disease. Comparison Study:  None Performing Technologist: Alecia Mackin RVT, RDCS (AE), RDMS  Examination Guidelines: A complete evaluation includes B-mode imaging, spectral Doppler, color Doppler, and power Doppler as needed of all accessible portions of each vessel. Bilateral testing is considered an integral part of a complete examination. Limited examinations for reoccurring indications may be performed as noted.  Right Carotid Findings: +----------+--------+--------+--------+--------------------------+-------------+           PSV cm/sEDV cm/sStenosisPlaque Description        Comments      +----------+--------+--------+--------+--------------------------+-------------+ CCA Prox  130     27                                                      +----------+--------+--------+--------+--------------------------+-------------+ CCA Distal77      22      <50%    homogeneous and calcific                +----------+--------+--------+--------+--------------------------+-------------+ ICA Prox  70      22              heterogenous, calcific and                                                smooth                                  +----------+--------+--------+--------+--------------------------+-------------+ ICA Mid   121     48      40-59%                            low end range +----------+--------+--------+--------+--------------------------+-------------+ ICA Distal109     50                                                      +----------+--------+--------+--------+--------------------------+-------------+ ECA       198     48      >50%    heterogenous and calcific                +----------+--------+--------+--------+--------------------------+-------------+ +----------+--------+-------+----------------+-------------------+           PSV cm/sEDV cmsDescribe        Arm Pressure (mmHG) +----------+--------+-------+----------------+-------------------+ [id number]            Multiphasic, [id number]                 +----------+--------+-------+----------------+-------------------+ +---------+--------+--+--------+--+---------+ VertebralPSV cm/s67EDV cm/s19Antegrade +---------+--------+--+--------+--+---------+  Left Carotid Findings: +----------+--------+--------+--------+----------------------+-------------+           PSV cm/sEDV cm/sStenosisPlaque Description    Comments      +----------+--------+--------+--------+----------------------+-------------+  CCA Prox  116     30                                                  +----------+--------+--------+--------+----------------------+-------------+ CCA Mid   87      25      <50%    smooth and homogeneous              +----------+--------+--------+--------+----------------------+-------------+ CCA Distal53      18              homogeneous and smooth              +----------+--------+--------+--------+----------------------+-------------+ ICA Prox  89      35              smooth and calcific                 +----------+--------+--------+--------+----------------------+-------------+ ICA Mid   105     40      40-59%                        low end range +----------+--------+--------+--------+----------------------+-------------+ ICA Distal101     30                                                  +----------+--------+--------+--------+----------------------+-------------+ ECA       159     27              homogeneous and smooth              +----------+--------+--------+--------+----------------------+-------------+  +----------+--------+--------+----------------+-------------------+           PSV cm/sEDV cm/sDescribe        Arm Pressure (mmHG) +----------+--------+--------+----------------+-------------------+ Subclavian160             Multiphasic, [id number]                 +----------+--------+--------+----------------+-------------------+ +---------+--------+--+--------+--+---------+ VertebralPSV cm/s79EDV cm/s30Antegrade +---------+--------+--+--------+--+---------+   Summary: Right Carotid: Velocities in the right ICA are consistent with a low end range                40-59% stenosis. Non-hemodynamically significant plaque <50%                noted in the CCA. The ECA appears >50% stenosed. Left Carotid: Velocities in the left ICA are consistent with a low end range               40-59% stenosis. Non-hemodynamically significant plaque <50% noted               in the CCA. Vertebrals:  Bilateral vertebral arteries demonstrate antegrade flow. Subclavians: Normal flow hemodynamics were seen in bilateral subclavian              arteries. *See table(s) above for measurements and observations. Suggest follow up study in 12 months. Electronically signed by Ena Dawley MD on 01/12/2020 at 5:32:47 PM.    Final     Assessment & Plan:   Abdifatah was seen today for annual exam, gastroesophageal reflux, hypertension and hyperlipidemia.  Diagnoses and all orders for this visit:  Primary hypertension- His blood pressure is not adequately well controlled.  Will restart  the ARB and beta-blocker.  Labs are negative for secondary causes or endorgan damage. -     Basic metabolic panel; Future -     TSH; Future -     Urinalysis, Routine w reflex microscopic; Future -     metoprolol succinate (TOPROL-XL) 25 MG 24 hr tablet; Take 1 tablet (25 mg total) by mouth daily. -     olmesartan (BENICAR) 20 MG tablet; Take 1 tablet (20 mg total) by mouth daily. -     EKG 12-Lead -     Urinalysis, Routine w reflex  microscopic -     TSH -     Basic metabolic panel  Hyperlipidemia LDL goal <70- LDL goal achieved. Doing well on the statin  -     Lipid panel; Future -     TSH; Future -     Hepatic function panel; Future -     atorvastatin (LIPITOR) 80 MG tablet; Take 1 tablet (80 mg total) by mouth daily. -     Hepatic function panel -     TSH -     Lipid panel  Vitamin D insufficiency -     VITAMIN D 25 Hydroxy (Vit-D Deficiency, Fractures); Future -     VITAMIN D 25 Hydroxy (Vit-D Deficiency, Fractures)  Tobacco abuse -     Ambulatory Referral for Lung Cancer Scre  Odynophagia -     Ambulatory referral to Gastroenterology  Encounter for general adult medical examination with abnormal findings- Exam completed, labs reviewed, vaccines reviewed and updated, cancer screenings are up-to-date, patient education was given. -     PSA; Future -     PSA  Gastroesophageal reflux disease with esophagitis without hemorrhage -     CBC with Differential/Platelet; Future -     pantoprazole (PROTONIX) 40 MG tablet; Take 1 tablet (40 mg total) by mouth daily. -     CBC with Differential/Platelet  Coronary artery disease involving native coronary artery of native heart without angina pectoris -     metoprolol succinate (TOPROL-XL) 25 MG 24 hr tablet; Take 1 tablet (25 mg total) by mouth daily. -     atorvastatin (LIPITOR) 80 MG tablet; Take 1 tablet (80 mg total) by mouth daily. -     aspirin EC 81 MG tablet; Take 1 tablet (81 mg total) by mouth daily. Swallow whole. -     olmesartan (BENICAR) 20 MG tablet; Take 1 tablet (20 mg total) by mouth daily. -     EKG 12-Lead   I have discontinued Jori Moll A. Mena's MELATONIN PO. I have also changed his metoprolol succinate, pantoprazole, atorvastatin, and aspirin EC. Additionally, I am having him start on olmesartan and Shingrix. Lastly, I am having him maintain his isosorbide mononitrate, cetirizine, acetaminophen, nicotine, clopidogrel, Cholecalciferol, and Fish  Oil.  Meds ordered this encounter  Medications   metoprolol succinate (TOPROL-XL) 25 MG 24 hr tablet    Sig: Take 1 tablet (25 mg total) by mouth daily.    Dispense:  90 tablet    Refill:  1   pantoprazole (PROTONIX) 40 MG tablet    Sig: Take 1 tablet (40 mg total) by mouth daily.    Dispense:  90 tablet    Refill:  1   atorvastatin (LIPITOR) 80 MG tablet    Sig: Take 1 tablet (80 mg total) by mouth daily.    Dispense:  90 tablet    Refill:  1   aspirin EC 81 MG tablet  Sig: Take 1 tablet (81 mg total) by mouth daily. Swallow whole.    Dispense:  90 tablet    Refill:  1   olmesartan (BENICAR) 20 MG tablet    Sig: Take 1 tablet (20 mg total) by mouth daily.    Dispense:  90 tablet    Refill:  0   Zoster Vaccine Adjuvanted [facility]) injection    Sig: Inject 0.5 mLs into the muscle once for 1 dose.    Dispense:  0.5 mL    Refill:  1     Follow-up: Return in about 3 months (around 09/11/2022).  Scarlette Calico, MD

## 2022-06-11 NOTE — Patient Instructions (Signed)
Health Maintenance, Male Adopting a healthy lifestyle and getting preventive care are important in promoting health and wellness. Ask your health care provider about: The right schedule for you to have regular tests and exams. Things you can do on your own to prevent diseases and keep yourself healthy. What should I know about diet, weight, and exercise? Eat a healthy diet  Eat a diet that includes plenty of vegetables, fruits, low-fat dairy products, and lean protein. Do not eat a lot of foods that are high in solid fats, added sugars, or sodium. Maintain a healthy weight Body mass index (BMI) is a measurement that can be used to identify possible weight problems. It estimates body fat based on height and weight. Your health care provider can help determine your BMI and help you achieve or maintain a healthy weight. Get regular exercise Get regular exercise. This is one of the most important things you can do for your health. Most adults should: Exercise for at least 150 minutes each week. The exercise should increase your heart rate and make you sweat (moderate-intensity exercise). Do strengthening exercises at least twice a week. This is in addition to the moderate-intensity exercise. Spend less time sitting. Even light physical activity can be beneficial. Watch cholesterol and blood lipids Have your blood tested for lipids and cholesterol at 52 years of age, then have this test every 5 years. You may need to have your cholesterol levels checked more often if: Your lipid or cholesterol levels are high. You are older than 52 years of age. You are at high risk for heart disease. What should I know about cancer screening? Many types of cancers can be detected early and may often be prevented. Depending on your health history and family history, you may need to have cancer screening at various ages. This may include screening for: Colorectal cancer. Prostate cancer. Skin cancer. Lung  cancer. What should I know about heart disease, diabetes, and high blood pressure? Blood pressure and heart disease High blood pressure causes heart disease and increases the risk of stroke. This is more likely to develop in people who have high blood pressure readings or are overweight. Talk with your health care provider about your target blood pressure readings. Have your blood pressure checked: Every 3-5 years if you are 18-39 years of age. Every year if you are 40 years old or older. If you are between the ages of 65 and 75 and are a current or former smoker, ask your health care provider if you should have a one-time screening for abdominal aortic aneurysm (AAA). Diabetes Have regular diabetes screenings. This checks your fasting blood sugar level. Have the screening done: Once every three years after age 45 if you are at a normal weight and have a low risk for diabetes. More often and at a younger age if you are overweight or have a high risk for diabetes. What should I know about preventing infection? Hepatitis B If you have a higher risk for hepatitis B, you should be screened for this virus. Talk with your health care provider to find out if you are at risk for hepatitis B infection. Hepatitis C Blood testing is recommended for: Everyone born from 1945 through 1965. Anyone with known risk factors for hepatitis C. Sexually transmitted infections (STIs) You should be screened each year for STIs, including gonorrhea and chlamydia, if: You are sexually active and are younger than 52 years of age. You are older than 52 years of age and your   health care provider tells you that you are at risk for this type of infection. Your sexual activity has changed since you were last screened, and you are at increased risk for chlamydia or gonorrhea. Ask your health care provider if you are at risk. Ask your health care provider about whether you are at high risk for HIV. Your health care provider  may recommend a prescription medicine to help prevent HIV infection. If you choose to take medicine to prevent HIV, you should first get tested for HIV. You should then be tested every 3 months for as long as you are taking the medicine. Follow these instructions at home: Alcohol use Do not drink alcohol if your health care provider tells you not to drink. If you drink alcohol: Limit how much you have to 0-2 drinks a day. Know how much alcohol is in your drink. In the [place] one drink equals one 12 oz bottle of beer (355 mL), one 5 oz glass of wine (148 mL), or one 1 oz glass of hard liquor (44 mL). Lifestyle Do not use any products that contain nicotine or tobacco. These products include cigarettes, chewing tobacco, and vaping devices, such as e-cigarettes. If you need help quitting, ask your health care provider. Do not use street drugs. Do not share needles. Ask your health care provider for help if you need support or information about quitting drugs. General instructions Schedule regular health, dental, and eye exams. Stay current with your vaccines. Tell your health care provider if: You often feel depressed. You have ever been abused or do not feel safe at home. Summary Adopting a healthy lifestyle and getting preventive care are important in promoting health and wellness. Follow your health care provider's instructions about healthy diet, exercising, and getting tested or screened for diseases. Follow your health care provider's instructions on monitoring your cholesterol and blood pressure. This information is not intended to replace advice given to you by your health care provider. Make sure you discuss any questions you have with your health care provider. Document Revised: 03/11/2021 Document Reviewed: 03/11/2021 Elsevier Patient Education  2023 [employer]  

## 2022-06-12 ENCOUNTER — Encounter: Payer: Self-pay | Admitting: Internal Medicine

## 2022-06-12 LAB — URINALYSIS, ROUTINE W REFLEX MICROSCOPIC
Bilirubin Urine: NEGATIVE
Ketones, ur: NEGATIVE
Leukocytes,Ua: NEGATIVE
Nitrite: NEGATIVE
Specific Gravity, Urine: 1.015 (ref 1.000–1.030)
Total Protein, Urine: NEGATIVE
Urine Glucose: 250 — AB
Urobilinogen, UA: 0.2 (ref 0.0–1.0)
WBC, UA: NONE SEEN (ref 0–?)
pH: 6 (ref 5.0–8.0)

## 2022-06-13 DIAGNOSIS — K2100 Gastro-esophageal reflux disease with esophagitis, without bleeding: Secondary | ICD-10-CM | POA: Insufficient documentation

## 2022-06-13 DIAGNOSIS — R81 Glycosuria: Secondary | ICD-10-CM | POA: Insufficient documentation

## 2022-06-14 MED ORDER — SHINGRIX 50 MCG/0.5ML IM SUSR: 0.5000 mL | Freq: Once | INTRAMUSCULAR | 1 refills | Status: AC

## 2022-06-17 ENCOUNTER — Encounter: Payer: Self-pay | Admitting: Internal Medicine

## 2022-06-25 ENCOUNTER — Other Ambulatory Visit: Payer: Self-pay

## 2022-06-25 DIAGNOSIS — F17210 Nicotine dependence, cigarettes, uncomplicated: Secondary | ICD-10-CM

## 2022-06-25 DIAGNOSIS — Z87891 Personal history of nicotine dependence: Secondary | ICD-10-CM

## 2022-06-25 DIAGNOSIS — Z122 Encounter for screening for malignant neoplasm of respiratory organs: Secondary | ICD-10-CM

## 2022-07-17 ENCOUNTER — Encounter: Payer: Self-pay | Admitting: Nurse Practitioner

## 2022-07-17 ENCOUNTER — Ambulatory Visit: Payer: 59 | Admitting: Nurse Practitioner

## 2022-07-17 VITALS — BP 126/84 | HR 73 | Ht 69.0 in | Wt 225.5 lb

## 2022-07-17 DIAGNOSIS — K219 Gastro-esophageal reflux disease without esophagitis: Secondary | ICD-10-CM

## 2022-07-17 DIAGNOSIS — R1310 Dysphagia, unspecified: Secondary | ICD-10-CM | POA: Diagnosis not present

## 2022-07-17 NOTE — Progress Notes (Signed)
07/17/2022 CAELEB BATALLA [id number] 02-11-1971   Chief Complaint: Difficulty swallowing  History of Present Illness: Hawkins Seaman. Likins is a 51 year old male with a past medical history of hypertension, hyperlipidemia, coronary artery disease s/p DES to the LAD 03/2019, sleep apnea, chronic tobacco use and GERD. He is followed by Dr. Ardis Hughs.  He presents today for further evaluation regarding dysphagia. He started having difficulty swallowing solids and liquids for the past month. He feel tight in the upper esophagus and he feels food and liquid pass down the esophagus slowly which sometimes is associated with esophageal discomfort.  Food or liquid does not get stuck in the esophagus.  He denies having any regurgitation or vomiting.  He has a history of GERD for which he was taking Pantoprazole 40 mg once every 2 to 3 days but over the past few weeks he was having more heartburn after eating foods like pizza so he started taking Pantoprazole twice daily and his heartburn has decreased.  He endorsed undergoing an EGD 7 years ago by gastroenterologist in [place] which possibly showed evidence of GERD.  He was prescribed an antibiotic towards the end of July 2023 for a cracked tooth/crown which she took twice daily for 2 to 3 weeks.  His dysphagia symptoms started after he took this antibiotic.  No history of oral thrush.  He is passing normal formed brown bowel movement daily.  No rectal bleeding or black stools.  He previously had chronic loose stools which abated after he stopped taking Melatonin 1 year ago.  He underwent a colonoscopy in 2018 which suggested inflammation to the TI and in the right colon, pathology without evidence of active or chronic inflammation.  He has a history of coronary artery disease status post DES 03/2019, previously on Plavix which was discontinued and he is on ASA 81 mg daily.  He denies having any chest pain or shortness of breath.  He is able to climb up 2 flights of  stairs without difficulty.     Latest Ref Rng & Units 06/11/2022    4:13 PM 09/09/2021   11:44 AM 05/09/2021    9:05 AM  CBC  WBC 4.0 - 10.5 K/uL 9.0  7.8  8.0   Hemoglobin 13.0 - 17.0 g/dL 14.9  15.7  14.8   Hematocrit 39.0 - 52.0 % 43.9  46.3  42.9   Platelets 150.0 - 400.0 K/uL 234.0  242.0  254.0        Latest Ref Rng & Units 06/11/2022    4:13 PM 09/09/2021   11:44 AM 05/09/2021    9:05 AM  CMP  Glucose 70 - 99 mg/dL 79  87  76   BUN 6 - 23 mg/dL '21  11  12   '$ Creatinine 0.40 - 1.50 mg/dL 1.10  1.14  1.07   Sodium 135 - 145 mEq/L 136  137  135   Potassium 3.5 - 5.1 mEq/L 4.2  4.8  4.7   Chloride 96 - 112 mEq/L 102  103  101   CO2 19 - 32 mEq/L '26  28  28   '$ Calcium 8.4 - 10.5 mg/dL 9.5  9.1  9.3   Total Protein 6.0 - 8.3 g/dL 6.7  7.1  6.6   Total Bilirubin 0.2 - 1.2 mg/dL 0.6  0.9  0.8   Alkaline Phos 39 - 117 U/L 52  61  68   AST 0 - 37 U/L 20  17  16  ALT 0 - 53 U/L '24  20  18     '$ ECHO 05/27/2019: 1. The left ventricle has normal systolic function with an ejection  fraction of 60-65%. The cavity size was normal. There is moderately  increased left ventricular wall thickness. Left ventricular diastolic  Doppler parameters are consistent with impaired   relaxation.   2. The right ventricle has normal systolic function. The cavity was  normal. There is no increase in right ventricular wall thickness.   3. The aorta is normal in size and structure.   Colonoscopy 03/25/2017: - Mild to moderate inflammation in right colon and terminal ileum. Multiple biopsies taken. 1. Surgical [P], terminal ileum - BENIGN SMALL BOWEL MUCOSA WITH EROSIONS. - NO ACTIVE INFLAMMATION. - NO DYSPLASIA OR MALIGNANCY. 2. Surgical [P], right colon, BX - BENIGN COLONIC MUCOSA WITH EROSIONS. - NO ACTIVE INFLAMMATION OR EVIDENCE OF MICROSCOPIC COLITIS. - NO DYSPLASIA OR MALIGNANCY. 3. Surgical [P], left colon, BX (normal) - BENIGN COLONIC MUCOSA. - NO ACTIVE INFLAMMATION OR EVIDENCE OF MICROSCOPIC  COLITIS. - NO DYSPLASIA OR  Past Medical History:  Diagnosis Date   Cataract    GERD (gastroesophageal reflux disease)    Hypertension    Sleep apnea    Past Surgical History:  Procedure Laterality Date   CATARACT EXTRACTION  2012   right eye   CORONARY STENT INTERVENTION N/A 04/01/2019   Procedure: CORONARY STENT INTERVENTION;  Surgeon: Leonie Man, MD;  Location: [facility];  Service: Cardiovascular;  Laterality: N/A;   DENTAL RESTORATION/EXTRACTION WITH X-RAY     LEFT HEART CATH AND CORONARY ANGIOGRAPHY N/A 04/01/2019   Procedure: LEFT HEART CATH AND CORONARY ANGIOGRAPHY;  Surgeon: Leonie Man, MD;  Location: [facility];  Service: Cardiovascular;  Laterality: N/A;   Current Medications, Allergies, Past Medical History, Past Surgical History, Family History and Social History were reviewed in [employer] record.  Review of Systems:   Constitutional: Negative for fever, sweats, chills or weight loss.  Respiratory: Negative for shortness of breath.   Cardiovascular: Negative for chest pain, palpitations and leg swelling.  Gastrointestinal: See HPI.  Musculoskeletal: Negative for back pain or muscle aches.  Neurological: Negative for dizziness, headaches or paresthesias.   Physical Exam: BP 126/84   Pulse 73   Ht '5\' 9"'$  (1.753 m)   Wt 225 lb 8 oz (102.3 kg)   BMI 33.30 kg/m   General: 51 year old male in no acute distress. Head: Normocephalic and atraumatic. Eyes: No scleral icterus. Conjunctiva pink . Ears: Normal auditory acuity. Mouth: Dentition intact. No ulcers or lesions.  No evidence of oral thrush. Lungs: Clear throughout to auscultation. Heart: Regular rate and rhythm, no murmur. Abdomen: Soft, nontender and nondistended. No masses or hepatomegaly. Normal bowel sounds x 4 quadrants.  Rectal: Deferred. Musculoskeletal: Symmetrical with no gross deformities. Extremities: No edema. Neurological: Alert oriented x 4. No  focal deficits.  Psychological: Alert and cooperative. Normal mood and affect  Assessment and Recommendations:  18) 51 year old male with new onset dysphagia with solids and liquids x 4 weeks which started after he took an antibiotic for 2 to 3 weeks due to having a dental issues. -Consider empiric treatment for suspected esophageal candidiasis if symptoms worsen prior to EGD -EGD with possible esophageal dilatation benefits and risks discussed including risk with sedation, risk of bleeding, perforation and infection  -Patient instructed to cut food into small pieces, chew food thoroughly and avoid large pieces of bread/meat and rice -Patient to contact  her office if his symptoms worsen prior to his EGD date  2) GERD.  Reported history of EGD 7 years ago which possibly showed evidence of GERD.  Heartburn triggered by tomato/acidic foods -Continue Pantoprazole 40 mg once daily -EGD as ordered above -Avoid spicy/acidic foods  3) Chronic diarrhea, abated after melatonin discontinued  4) Colon cancer screening.  Normal colonoscopy 03/2017. -Next screening colonoscopy due 03/2027  5) CAD s/p DES 03/2019, no longer on Plavix. Remains on ASA '81mg'$  daily.  No chest pain or shortness of breath.  Further recommendations to be determined after EGD completed

## 2022-07-17 NOTE — Patient Instructions (Signed)
_______________________________________________________  If you are age 52 or older, your body mass index should be between 23-30. Your Body mass index is 33.3 kg/m. If this is out of the aforementioned range listed, please consider follow up with your Primary Care Provider.  If you are age 69 or younger, your body mass index should be between 19-25. Your Body mass index is 33.3 kg/m. If this is out of the aformentioned range listed, please consider follow up with your Primary Care Provider.   You have been scheduled for an endoscopy. Please follow written instructions given to you at your visit today. If you use inhalers (even only as needed), please bring them with you on the day of your procedure.  Continue Pantoprazole '40mg'$  daily.  Avoid eating large pieces of bread and meat.  The [facility] providers would like to encourage you to use [facility] to communicate with providers for non-urgent requests or questions.  Due to long hold times on the telephone, sending your provider a message by [facility] may be a faster and more efficient way to get a response.  Please allow 48 business hours for a response.  Please remember that this is for non-urgent requests.   It was a pleasure to see you today!  Thank you for trusting me with your gastrointestinal care!

## 2022-07-18 NOTE — Progress Notes (Signed)
Agree with assessment and plan as outlined.  

## 2022-07-22 ENCOUNTER — Encounter: Payer: Self-pay | Admitting: Acute Care

## 2022-07-22 ENCOUNTER — Ambulatory Visit (INDEPENDENT_AMBULATORY_CARE_PROVIDER_SITE_OTHER): Payer: 59 | Admitting: Acute Care

## 2022-07-22 DIAGNOSIS — F17210 Nicotine dependence, cigarettes, uncomplicated: Secondary | ICD-10-CM

## 2022-07-22 NOTE — Patient Instructions (Signed)
Thank you for participating in the [place] [facility] Scre[facility]. It was our pleasure to meet you today. We will call you with the results of your scan within the next few days. Your scan will be assigned a Lung RADS category score by the physicians reading the scans.  This Lung RADS score determines follow up scanning.  See below for description of categories, and follow up screening recommendations. We will be in touch to schedule your follow up screening annually or based on recommendations of our providers. We will fax a copy of your scan results to your Primary Care Physician, or the physician who referred you to the program, to ensure they have the results. Please call the office if you have any questions or concerns regarding your scanning experience or results.  Our office number is [phone]. Please speak with Denise Phelps, RN. , or  Denise Buckner RN, They are  our Lung Cancer Screening RN.'s If They are unavailable when you call, Please leave a message on the voice mail. We will return your call at our earliest convenience.This voice mail is monitored several times a day.  Remember, if your scan is normal, we will scan you annually as long as you continue to meet the criteria for the program. (Age 55-77, Current smoker or smoker who has quit within the last 15 years). If you are a smoker, remember, quitting is the single most powerful action that you can take to decrease your risk of lung cancer and other pulmonary, breathing related problems. We know quitting is hard, and we are here to help.  Please let us know if there is anything we can do to help you meet your goal of quitting. If you are a former smoker, congratulations. We are proud of you! Remain smoke free! Remember you can refer friends or family members through the number above.  We will screen them to make sure they meet criteria for the program. Thank you for helping us take better care of you by  participating in Lung Screening.  You can receive free nicotine replacement therapy ( patches, gum or mints) by calling 1-800-QUIT NOW. Please call so we can get you on the path to becoming  a non-smoker. I know it is hard, but you can do this!  Lung RADS Categories:  Lung RADS 1: no nodules or definitely non-concerning nodules.  Recommendation is for a repeat annual scan in 12 months.  Lung RADS 2:  nodules that are non-concerning in appearance and behavior with a very low likelihood of becoming an active cancer. Recommendation is for a repeat annual scan in 12 months.  Lung RADS 3: nodules that are probably non-concerning , includes nodules with a low likelihood of becoming an active cancer.  Recommendation is for a 6-month repeat screening scan. Often noted after an upper respiratory illness. We will be in touch to make sure you have no questions, and to schedule your 6-month scan.  Lung RADS 4 A: nodules with concerning findings, recommendation is most often for a follow up scan in 3 months or additional testing based on our provider's assessment of the scan. We will be in touch to make sure you have no questions and to schedule the recommended 3 month follow up scan.  Lung RADS 4 B:  indicates findings that are concerning. We will be in touch with you to schedule additional diagnostic testing based on our provider's  assessment of the scan.  Other options for assistance in smoking cessation (   As covered by your insurance benefits)  Hypnosis for smoking cessation  [employer] [phone]  Acupuncture for smoking cessation  [facility] [phone]   

## 2022-07-22 NOTE — Progress Notes (Signed)
Virtual Visit via Telephone Note  I connected with Jeffery Walker on 09/17/21 at  2:00 PM EST by telephone and verified that I am speaking with the correct person using two identifiers.  Location: Patient: Home Provider: Working from   I discussed the limitations, risks, security and privacy concerns of performing an evaluation and management service by telephone and the availability of in person appointments. I also discussed with the patient that there may be a patient responsible charge related to this service. The patient expressed understanding and agreed to proceed.  Shared Decision Making Visit Lung Cancer Screening Program [phone])   Eligibility: Age 11 y.o. Pack Years Smoking History Calculation 46 (# packs/per year x # years smoked) Recent History of coughing up blood  no Unexplained weight loss? no ( >Than 15 pounds within the last 6 months ) Prior History Lung / other cancer no (Diagnosis within the last 5 years already requiring surveillance chest CT Scans). Smoking Status Current Smoker Former Smokers: Years since quit: NA  Quit Date: NA  Visit Components: Discussion included one or more decision making aids. yes Discussion included risk/benefits of screening. yes Discussion included potential follow up diagnostic testing for abnormal scans. yes Discussion included meaning and risk of over diagnosis. yes Discussion included meaning and risk of False Positives. yes Discussion included meaning of total radiation exposure. yes  Counseling Included: Importance of adherence to annual lung cancer LDCT screening. yes Impact of comorbidities on ability to participate in the program. yes Ability and willingness to under diagnostic treatment. yes  Smoking Cessation Counseling: Current Smokers:  Discussed importance of smoking cessation. yes Information about tobacco cessation classes and interventions provided to patient. yes Patient provided with "ticket" for LDCT  Scan. yes Symptomatic Patient. yes  Counseling(Intermediate counseling: > three minutes) [address]406 Diagnosis Code: Tobacco Use Z72.0 Asymptomatic Patient no  Counseling NA Former Smokers:  Discussed the importance of maintaining cigarette abstinence. yes Diagnosis Code: Personal History of Nicotine Dependence. A25.053 Information about tobacco cessation classes and interventions provided to patient. Yes Patient provided with "ticket" for LDCT Scan. yes Written Order for Lung Cancer Screening with LDCT placed in Epic. Yes (CT Chest Lung Cancer Screening Low Dose W/O CM) [id number] Z12.2-Screening of respiratory organs Z87.891-Personal history of nicotine dependence   I spent 25 minutes of face to face time with him discussing the risks and benefits of lung cancer screening. We viewed a power point together that explained in detail the above noted topics. We took the time to pause the power point at intervals to allow for questions to be asked and answered to ensure understanding. We discussed that he had taken the single most powerful action possible to decrease his risk of developing lung cancer when he quit smoking. I counseled him to remain smoke free, and to contact me if he ever had the desire to smoke again so that I can provide resources and tools to help support the effort to remain smoke free. We discussed the time and location of the scan, and that either  Doroteo Glassman RN or I will call with the results within  24-48 hours of receiving them. He has my card and contact information in the event he needs to speak with me, in addition to a copy of the power point we reviewed as a resource. He verbalized understanding of all of the above and had no further questions upon leaving the office.     I explained to the patient that there has been a high  incidence of coronary artery disease noted on these exams. I explained that this is a non-gated exam therefore degree or severity cannot be determined.  This patient is on statin therapy. I have asked the patient to follow-up with their PCP regarding any incidental finding of coronary artery disease and management with diet or medication as they feel is clinically indicated. The patient verbalized understanding of the above and had no further questions.    I spent 3 minutes counseling on smoking cessation and the health risks of continued tobacco abuse    Maleia Weems D. Kenton Kingfisher, NP-C [facility] Personal contact information can be found on Amion  07/22/2022, 8:10 AM

## 2022-07-23 ENCOUNTER — Ambulatory Visit (HOSPITAL_BASED_OUTPATIENT_CLINIC_OR_DEPARTMENT_OTHER)
Admission: RE | Admit: 2022-07-23 | Discharge: 2022-07-23 | Disposition: A | Discharge status: Home / Self Care | Payer: 59 | Source: Ambulatory Visit | Attending: Acute Care | Admitting: Acute Care

## 2022-07-23 DIAGNOSIS — I2510 Atherosclerotic heart disease of native coronary artery without angina pectoris: Secondary | ICD-10-CM | POA: Diagnosis not present

## 2022-07-23 DIAGNOSIS — Z87891 Personal history of nicotine dependence: Secondary | ICD-10-CM

## 2022-07-23 DIAGNOSIS — F17210 Nicotine dependence, cigarettes, uncomplicated: Secondary | ICD-10-CM | POA: Diagnosis not present

## 2022-07-23 DIAGNOSIS — I700 Atherosclerosis of aorta: Secondary | ICD-10-CM | POA: Diagnosis not present

## 2022-07-23 DIAGNOSIS — Z122 Encounter for screening for malignant neoplasm of respiratory organs: Secondary | ICD-10-CM | POA: Diagnosis present

## 2022-07-23 DIAGNOSIS — J439 Emphysema, unspecified: Secondary | ICD-10-CM | POA: Diagnosis not present

## 2022-07-24 ENCOUNTER — Telehealth: Payer: Self-pay | Admitting: Acute Care

## 2022-07-24 DIAGNOSIS — Z122 Encounter for screening for malignant neoplasm of respiratory organs: Secondary | ICD-10-CM

## 2022-07-24 DIAGNOSIS — Z87891 Personal history of nicotine dependence: Secondary | ICD-10-CM

## 2022-07-24 NOTE — Telephone Encounter (Signed)
Spoke with patient by phone to review results of LDCT.   Atherosclerosis and emphysema noted.  Patient is on statin medication.  No findings suspicious for lung cancer. Thickening noted in the esophageal wall and patient has experienced some difficulty swallowing and has EGD 07/25/22 with Dr. Havery Moros.  Patient verbalized understanding.  Will place order for annual LDCT 2024.  Results faxed to PCP.

## 2022-07-24 NOTE — Telephone Encounter (Signed)
Left VM and call back number for patient to call to review results of LDCT

## 2022-07-25 ENCOUNTER — Ambulatory Visit (AMBULATORY_SURGERY_CENTER): Payer: 59 | Admitting: Gastroenterology

## 2022-07-25 ENCOUNTER — Encounter: Payer: Self-pay | Admitting: Gastroenterology

## 2022-07-25 ENCOUNTER — Telehealth: Payer: Self-pay

## 2022-07-25 VITALS — BP 160/78 | HR 64 | Temp 97.8°F | Resp 17 | Ht 69.0 in | Wt 225.0 lb

## 2022-07-25 DIAGNOSIS — K2900 Acute gastritis without bleeding: Secondary | ICD-10-CM

## 2022-07-25 DIAGNOSIS — C155 Malignant neoplasm of lower third of esophagus: Secondary | ICD-10-CM | POA: Diagnosis not present

## 2022-07-25 DIAGNOSIS — K2960 Other gastritis without bleeding: Secondary | ICD-10-CM | POA: Diagnosis not present

## 2022-07-25 DIAGNOSIS — K2289 Other specified disease of esophagus: Secondary | ICD-10-CM

## 2022-07-25 DIAGNOSIS — K222 Esophageal obstruction: Secondary | ICD-10-CM | POA: Diagnosis not present

## 2022-07-25 DIAGNOSIS — K449 Diaphragmatic hernia without obstruction or gangrene: Secondary | ICD-10-CM | POA: Diagnosis not present

## 2022-07-25 DIAGNOSIS — R1310 Dysphagia, unspecified: Secondary | ICD-10-CM

## 2022-07-25 DIAGNOSIS — K31A0 Gastric intestinal metaplasia, unspecified: Secondary | ICD-10-CM

## 2022-07-25 MED ORDER — PANTOPRAZOLE SODIUM 40 MG PO TBEC: 40.0000 mg | DELAYED_RELEASE_TABLET | Freq: Two times a day (BID) | ORAL | 3 refills | Status: DC

## 2022-07-25 MED ORDER — SODIUM CHLORIDE 0.9 % IV SOLN: 500.0000 mL | Freq: Once | INTRAVENOUS | Status: DC

## 2022-07-25 NOTE — Telephone Encounter (Signed)
CT order in epic. Urgent-secure staff message sent to radiology scheduling to contact patient ASAP to schedule appt.

## 2022-07-25 NOTE — Progress Notes (Signed)
Called to room to assist during endoscopic procedure.  Patient ID and intended procedure confirmed with present staff. Received instructions for my participation in the procedure from the performing physician.  

## 2022-07-25 NOTE — Telephone Encounter (Signed)
-----   Message from Yetta Flock, MD sent at 07/25/2022 12:55 PM EDT ----- Regarding: CT scan [facility] this patient had an esophageal mass on EGD today. He needs CT chest / abdomen / pelvis with contrast if you can coordinate. I think the [facility] already gave him the oral contrast. Thanks

## 2022-07-25 NOTE — Progress Notes (Signed)
Report to PACU, RN, vss, BBS= Clear.  

## 2022-07-25 NOTE — Progress Notes (Signed)
History and Physical Interval Note: No changes since clinic visit on 9/14. Patient with dysphagia, history of GERD. EGD to further evaluate and dilate if needed. Patient has been off Plavix, does not take it anymore, on baby aspirin. Have discussed risks / benefits he wishes to proceed.  07/25/2022 10:33 AM  Jeffery Walker  has presented today for endoscopic procedure(s), with the diagnosis of  Encounter Diagnosis  Name Primary?   Dysphagia, unspecified type Yes  .  The various methods of evaluation and treatment have been discussed with the patient and/or family. After consideration of risks, benefits and other options for treatment, the patient has consented to  the endoscopic procedure(s).   The patient's history has been reviewed, patient examined, no change in status, stable for surgery.  I have reviewed the patient's chart and labs.  Questions were answered to the patient's satisfaction.    Jolly Mango, MD [facility]

## 2022-07-25 NOTE — Op Note (Signed)
[facility] Patient Name: Jeffery Walker Procedure Date: 07/25/2022 10:26 AM MRN: [id number] Endoscopist: Remo Lipps P. Havery Moros , MD Age: 51 Referring MD:  Date of Birth: 1971-01-14 Gender: Male Account #: [id number] Procedure:                Upper GI endoscopy Indications:              Dysphagia, history of GERD Medicines:                Monitored Anesthesia Care Procedure:                Pre-Anesthesia Assessment:                           - Prior to the procedure, a History and Physical                            was performed, and patient medications and                            allergies were reviewed. The patient's tolerance of                            previous anesthesia was also reviewed. The risks                            and benefits of the procedure and the sedation                            options and risks were discussed with the patient.                            All questions were answered, and informed consent                            was obtained. Prior Anticoagulants: The patient has                            taken no previous anticoagulant or antiplatelet                            agents. ASA Grade Assessment: II - A patient with                            mild systemic disease. After reviewing the risks                            and benefits, the patient was deemed in                            satisfactory condition to undergo the procedure.                           After obtaining informed consent, the endoscope was  passed under direct vision. Throughout the                            procedure, the patient's blood pressure, pulse, and                            oxygen saturations were monitored continuously. The                            GIF [id number] was introduced through the                            mouth, and advanced to the second part of duodenum.                            The upper GI endoscopy  was accomplished without                            difficulty. The patient tolerated the procedure                            well. Scope In: Scope Out: Findings:                 Esophagogastric landmarks were identified: the                            Z-line was found at 34 cm, the gastroesophageal                            junction was found at 38 cm and the upper extent of                            the gastric folds was found at 43 cm from the                            incisors.                           A 5 cm hiatal hernia was present.                           There were esophageal mucosal changes classified as                            suspected Barrett's stage C3-M4 per Prague criteria                            present in the lower third of the esophagus. The                            maximum longitudinal extent of these mucosal                            changes  was 4 cm in length.                           A suspected 4cm mass was found in the lower third                            of the esophagus, 34 to 38 cm from the incisors.                            The mass was non-obstructing and partially                            circumferential (involving one-third of the lumen                            circumference). Biopsies were taken with a cold                            forceps for histology.                           The exam of the esophagus was otherwise normal.                           Patchy inflammation characterized by friability and                            granularity was found in the gastric body. Biopsies                            were taken with a cold forceps for Helicobacter                            pylori testing.                           The exam of the stomach was otherwise normal.                           An acquired benign-appearing, intrinsic mild                            stenosis was found in the duodenal bulb and was                             traversed.                           The exam of the duodenum was otherwise normal. Complications:            No immediate complications. Estimated blood loss:                            Minimal. Estimated Blood Loss:     Estimated blood loss was minimal. Impression:               -  Esophagogastric landmarks identified.                           - 5 cm hiatal hernia.                           - Esophageal mucosal changes classified as                            suspected Barrett's stage C3-M4 per Prague criteria.                           - Likely malignant esophageal tumor was found in                            the lower third of the esophagus. Biopsied.                           - Gastritis. Biopsied.                           - Normal stomach otherwise                           - Mild benign appearing duodenal stenosis.                           - Normal duodenum otherwise Recommendation:           - Patient has a contact number available for                            emergencies. The signs and symptoms of potential                            delayed complications were discussed with the                            patient. Return to normal activities tomorrow.                            Written discharge instructions were provided to the                            patient.                           - Resume previous diet.                           - Continue present medications.                           - Increase protonix to twice daily                           - Await pathology results. Sent rush                           -  Recommend CT chest / abdomen / pelvis with                            contrast to further evaluate - will discuss with                            the patient Carlota Raspberry. Annisha Baar, MD 07/25/2022 11:04:37 AM This report has been signed electronically.

## 2022-07-25 NOTE — Patient Instructions (Addendum)
Information on hiatal hernia and gastritis given to you today.  Increase Protonix to twice daily.  Await pathology results from the biopsies taken today.  Resume previous diet and medications.  Recommend CT of chest/abdomen/pelvis with contrast to further evaluate.      YOU HAD AN ENDOSCOPIC PROCEDURE TODAY AT [facility]:   Refer to the procedure report that was given to you for any specific questions about what was found during the examination.  If the procedure report does not answer your questions, please call your gastroenterologist to clarify.  If you requested that your care partner not be given the details of your procedure findings, then the procedure report has been included in a sealed envelope for you to review at your convenience later.  YOU SHOULD EXPECT: Some feelings of bloating in the abdomen. Passage of more gas than usual.  Walking can help get rid of the air that was put into your GI tract during the procedure and reduce the bloating. If you had a lower endoscopy (such as a colonoscopy or flexible sigmoidoscopy) you may notice spotting of blood in your stool or on the toilet paper. If you underwent a bowel prep for your procedure, you may not have a normal bowel movement for a few days.  Please Note:  You might notice some irritation and congestion in your nose or some drainage.  This is from the oxygen used during your procedure.  There is no need for concern and it should clear up in a day or so.  SYMPTOMS TO REPORT IMMEDIATELY:  Following upper endoscopy (EGD)  Vomiting of blood or coffee ground material  New chest pain or pain under the shoulder blades  Painful or persistently difficult swallowing  New shortness of breath  Fever of 100F or higher  Black, tarry-looking stools  For urgent or emergent issues, a gastroenterologist can be reached at any hour by calling [phone]. Do not use MyChart messaging for urgent concerns.    DIET:  We  do recommend a small meal at first, but then you may proceed to your regular diet.  Drink plenty of fluids but you should avoid alcoholic beverages for 24 hours.  ACTIVITY:  You should plan to take it easy for the rest of today and you should NOT DRIVE or use heavy machinery until tomorrow (because of the sedation medicines used during the test).    FOLLOW UP: Our staff will call the number listed on your records the next business day following your procedure.  We will call around 7:15- 8:00 am to check on you and address any questions or concerns that you may have regarding the information given to you following your procedure. If we do not reach you, we will leave a message.     If any biopsies were taken you will be contacted by phone or by letter within the next 1-3 weeks.  Please call us at [phone] if you have not heard about the biopsies in 3 weeks.    SIGNATURES/CONFIDENTIALITY: You and/or your care partner have signed paperwork which will be entered into your electronic medical record.  These signatures attest to the fact that that the information above on your After Visit Summary has been reviewed and is understood.  Full responsibility of the confidentiality of this discharge information lies with you and/or your care-partner.

## 2022-07-28 ENCOUNTER — Telehealth: Payer: Self-pay | Admitting: *Deleted

## 2022-07-28 ENCOUNTER — Telehealth: Payer: Self-pay | Admitting: Gastroenterology

## 2022-07-28 NOTE — Telephone Encounter (Signed)
  Follow up Call-     07/25/2022    9:19 AM  Call back number  Post procedure Call Back phone  # [phone]  Permission to leave phone message Yes     Patient questions:   Do you have a fever, pain , or abdominal swelling? No. Pain Score  0 *  Have you tolerated food without any problems? Yes.    Have you been able to return to your normal activities? Yes.    Do you have any questions about your discharge instructions: Diet   No. Medications  No. Follow up visit  No.  Do you have questions or concerns about your Care? No.  Actions: * If pain score is 4 or above: No action needed, pain <4.

## 2022-07-28 NOTE — Telephone Encounter (Signed)
Pt appt.date sched for 08/01/22 @ 7:30am  S/w pts wife pt needs arrive @ 7am @ [facility] 4hrs prior, pt already has oral prep.Marland Kitchen

## 2022-07-28 NOTE — Addendum Note (Signed)
Addended by: Yevette Edwards on: 07/28/2022 04:58 PM   Modules accepted: Orders

## 2022-07-28 NOTE — Telephone Encounter (Signed)
Patient's wife called, states she would like a call back regarding her husband lab results, because a CT scan order is sent for patient, with the lab result. Please call to advise.

## 2022-07-28 NOTE — Telephone Encounter (Addendum)
Patients daughter called to ask if the insurance PA can be expidited because the appt will be canceled for tomm.

## 2022-07-28 NOTE — Telephone Encounter (Signed)
Returned call to patient. He stated that he wanted to know if he could have a sooner CT appt. I provided patient with the phone # to radiology scheduling so that they can reschedule for him. Pt verbalized understanding and had no concerns at the end of the call.

## 2022-07-29 ENCOUNTER — Ambulatory Visit (HOSPITAL_COMMUNITY): Payer: 59

## 2022-07-29 ENCOUNTER — Other Ambulatory Visit: Payer: Self-pay

## 2022-07-29 DIAGNOSIS — C159 Malignant neoplasm of esophagus, unspecified: Secondary | ICD-10-CM

## 2022-07-29 DIAGNOSIS — A048 Other specified bacterial intestinal infections: Secondary | ICD-10-CM

## 2022-07-29 MED ORDER — CLARITHROMYCIN 500 MG PO TABS: 500.0000 mg | ORAL_TABLET | Freq: Two times a day (BID) | ORAL | 0 refills | Status: AC

## 2022-07-29 MED ORDER — AMOXICILLIN 500 MG PO TABS: 1000.0000 mg | ORAL_TABLET | Freq: Two times a day (BID) | ORAL | 0 refills | Status: AC

## 2022-07-29 MED ORDER — METRONIDAZOLE 500 MG PO TABS: 500.0000 mg | ORAL_TABLET | Freq: Two times a day (BID) | ORAL | 0 refills | Status: AC

## 2022-07-29 NOTE — Telephone Encounter (Signed)
Order was changed to CT abdomen and pelvis only per Dr. Havery Moros. Please obtain new auth ASAP. Thank you

## 2022-07-29 NOTE — Addendum Note (Signed)
Addended by: Yevette Edwards on: 07/29/2022 09:38 AM   Modules accepted: Orders

## 2022-07-29 NOTE — Telephone Encounter (Signed)
Per Dr. Havery Moros - CT chest with contrast and CT abdomen with contrast since this is all that insurance will cover Amy notified patient's daughter of authorization and provided them with the authorization # and phone # to radiology scheduling. Auth# [id number] good 07/29/22-09/12/22

## 2022-07-30 ENCOUNTER — Ambulatory Visit (HOSPITAL_COMMUNITY)
Admission: RE | Admit: 2022-07-30 | Discharge: 2022-07-30 | Disposition: A | Discharge status: Home / Self Care | Payer: 59 | Source: Ambulatory Visit | Attending: Gastroenterology | Admitting: Gastroenterology

## 2022-07-30 ENCOUNTER — Other Ambulatory Visit (HOSPITAL_COMMUNITY): Payer: 59

## 2022-07-30 ENCOUNTER — Telehealth: Payer: Self-pay | Admitting: Hematology

## 2022-07-30 DIAGNOSIS — K2289 Other specified disease of esophagus: Secondary | ICD-10-CM | POA: Insufficient documentation

## 2022-07-30 MED ORDER — IOHEXOL 300 MG/ML  SOLN
100.0000 mL | Freq: Once | INTRAMUSCULAR | Status: AC | PRN
  Administered 2022-07-30: 100 mL via INTRAVENOUS

## 2022-07-30 NOTE — Telephone Encounter (Signed)
Scheduled appt per 9/26 referral. Pt's wife is aware of appt date and time. Pt's wife is aware to arrive 15 mins prior to appt time and to bring and updated insurance card. Pt's wife is aware of appt location.

## 2022-07-31 NOTE — Progress Notes (Signed)
I called Mr Jeffery Walker.  I spoke to both him and his wife. I explained my role as a nurse navigator and provided my contact information.   Appt moved to 9/29 1130 with [name] and Dr Burr Medico.  Dr Lightfoot's appt was cancelled per his request.  Will be rescheduled after PET scan.  All questions were answered.  He verbalized understanding.

## 2022-07-31 NOTE — Progress Notes (Unsigned)
[place] Telephone:([phone]) [phone]   [phone]  CONSULT NOTE  REFERRING PHYSICIAN: Dr. Havery Moros   REASON FOR CONSULTATION:  Esophageal Adenocarcinoma   HPI Jeffery Walker is a 51 y.o. male with a past medical history of hypertension, hyperlipidemia, coronary artery disease s/p DES to the LAD 03/2019, sleep apnea, chronic tobacco use, and GERD is referred to the clinic today for newly diagnosed esophageal cancer.   The patient had new onset of odynophagia over the last few months Therefore, he made an appointment and saw Carl Best NP and Dr. Havery Moros on 07/17/22 for this concern. At that appointment, he was endorsing increasing difficultly swallowing mostly solids, which worsened after taking antibiotics for a dental infection a few weeks prior. He characterizes it feeling "tight" in his upper esophagus and the food is passing slowly. He was not endorsing choking. The food does go down. He cuts his food in small pieces and does drink liquid in between bites to help facilitate swallowing. He has a history of GERD, which was previously controlled, with his PPI. He typically only needed to take his PPI once every 2-3 days.   Therefore, Dr. Havery Moros arrange for an EGD which was performed on 07/25/2022.  This revealed a 4 cm mass in the lower third of the esophagus.  The mass was nonobstructing and partially circumferential there was also esophageal mucosal changes classified as suspected Barrett's esophagitis. He also was found to have gastritis that was positive for H. Pylori. He is being treated for this at this time.   The final pathology ([id number]) from the esophageal mass is consistent with moderately to poorly differentiated adenocarcinoma.   The patient had a staging CT of the chest and abdomen on 07/30/22 which revealed distal esophageal primary with isolated gastrohepatic ligament adenopathy highly suspicious for nodal metastasis.  This also  incidentally noted a 2.2 cm mass in the left kidney suspicious for renal cell carcinoma.  The patient was aware of this as it was also noted on his recent low-dose CT lung cancer screen earlier this month.  To the patient's knowledge, he was not referred to a urologist.  The patient was referred to Dr. Kipp Brood from cardiothoracic surgery. Dr. Kipp Brood is going to see him after his Pet scan. The patient is also schedule to go to [facility] on Monday 10/2 for a second opinion.  He is also scheduled for a PET scan at [facility] on 08/04/2022.   Overall, the patient states he feels fine besides the swallowing concerns. He denies appetite change or weight loss. He denies any fever, chills, night sweats, or lymphadenopathy. He denies pain but characterizes the discomfort in his low chest as "irritating". He rates this a 1-2/10.  He denies any nausea, diarrhea, or constipation. Denies epigastric pain. Denies any melena or hematemesis.  He denies shortness of breath, cough, or chest pain. Denies back pain, hematuria, or decreased urination.   He denies family history of cancer. His mother has diabetes. His father passed away at the age of 41 due to heart issues. His brother has had multiple amputations due to diabetes.   The patient works as a Designer, industrial/product.  He is married.  He has 1 daughter.  He is independent with his activities of daily living.  The patient is working on quitting smoking.  He is smoked approximately 1.5-2 ppd for last 40 years.  He is currently using a nicotine patch and would like a prescription. He is currently using the 34m/24  hour Nicoderm patch.  The patient used to drink 3-4 alcoholic beverages per day during the week and some on the weekend.  He estimates he would drink a case per week.  The patient quit drinking alcohol 1 week ago due to his recent diagnosis.  Denies any history of drug use.     HPI  Past Medical History:  Diagnosis Date   Cataract    GERD (gastroesophageal  reflux disease)    Hypertension    Sleep apnea     Past Surgical History:  Procedure Laterality Date   CATARACT EXTRACTION  2012   right eye   CORONARY STENT INTERVENTION N/A 04/01/2019   Procedure: CORONARY STENT INTERVENTION;  Surgeon: Leonie Man, MD;  Location: [place] [facility];  Service: Cardiovascular;  Laterality: N/A;   DENTAL RESTORATION/EXTRACTION WITH X-RAY     LEFT HEART CATH AND CORONARY ANGIOGRAPHY N/A 04/01/2019   Procedure: LEFT HEART CATH AND CORONARY ANGIOGRAPHY;  Surgeon: Leonie Man, MD;  Location: [place] [facility];  Service: Cardiovascular;  Laterality: N/A;    Family History  Problem Relation Age of Onset   Diabetes Mother    Heart disease Father    Colon cancer Neg Hx     Social History Social History   Tobacco Use   Smoking status: Every Day    Packs/day: 0.50    Years: 39.00    Total pack years: 19.50    Types: Cigarettes    Passive exposure: Current   Smokeless tobacco: Never  Vaping Use   Vaping Use: Never used  Substance Use Topics   Alcohol use: Yes    Alcohol/week: 3.0 - 4.0 standard drinks of alcohol    Types: 3 - 4 Cans of beer per week   Drug use: No    Allergies  Allergen Reactions   Lisinopril Cough    Current Outpatient Medications  Medication Sig Dispense Refill   acetaminophen (TYLENOL) 500 MG tablet Take 500 mg by mouth every 6 (six) hours as needed.     amoxicillin (AMOXIL) 500 MG tablet Take 2 tablets (1,000 mg total) by mouth 2 (two) times daily for 14 days. 56 tablet 0   aspirin EC 81 MG tablet Take 1 tablet (81 mg total) by mouth daily. Swallow whole. 90 tablet 1   atorvastatin (LIPITOR) 80 MG tablet Take 1 tablet (80 mg total) by mouth daily. 90 tablet 1   cetirizine (ZYRTEC) 10 MG tablet Take 10 mg by mouth daily as needed for allergies.     chlorhexidine (PERIDEX) 0.12 % solution SMARTSIG:By Mouth     Cholecalciferol 50 MCG (2000 UT) TABS Take 1 tablet (2,000 Units total) by mouth daily. 90 tablet 1    clarithromycin (BIAXIN) 500 MG tablet Take 1 tablet (500 mg total) by mouth 2 (two) times daily for 14 days. 28 tablet 0   metoprolol succinate (TOPROL-XL) 25 MG 24 hr tablet Take 1 tablet (25 mg total) by mouth daily. 90 tablet 1   metroNIDAZOLE (FLAGYL) 500 MG tablet Take 1 tablet (500 mg total) by mouth 2 (two) times daily for 14 days. 28 tablet 0   nicotine (NICODERM CQ) 21 mg/24hr patch Place 1 patch (21 mg total) onto the skin daily. 28 patch 1   olmesartan (BENICAR) 20 MG tablet Take 1 tablet (20 mg total) by mouth daily. 90 tablet 0   ondansetron (ZOFRAN) 8 MG tablet Take 1 tablet (8 mg total) by mouth every 8 (eight) hours as needed for nausea  or vomiting. Starting 3 days after chemotherapy 30 tablet 2   pantoprazole (PROTONIX) 40 MG tablet Take 1 tablet (40 mg total) by mouth 2 (two) times daily. 90 tablet 3   prochlorperazine (COMPAZINE) 10 MG tablet Take 1 tablet (10 mg total) by mouth every 6 (six) hours as needed. 30 tablet 2   isosorbide mononitrate (IMDUR) 30 MG 24 hr tablet Take 30 mg by mouth daily. (Patient not taking: Reported on 07/17/2022)     Omega-3 Fatty Acids (FISH OIL) 1200 MG CAPS Take by mouth. (Patient not taking: Reported on 08/01/2022)     No current facility-administered medications for this visit.    REVIEW OF SYSTEMS:   Review of Systems  Constitutional: Negative for appetite change, chills, fatigue, fever and unexpected weight change.  HENT: Positive for throat tightness with swallowing and slower passage of food through esophagus. Negative for mouth sores, nosebleeds, or sore throat.  Eyes: Negative for eye problems and icterus.  Respiratory: Negative for cough, hemoptysis, shortness of breath and wheezing.   Cardiovascular: Negative for chest pain and leg swelling.  Gastrointestinal: Negative for abdominal pain, constipation, diarrhea, nausea and vomiting.  Genitourinary: Negative for bladder incontinence, difficulty urinating, dysuria, frequency and  hematuria.   Musculoskeletal: Negative for back pain, gait problem, neck pain and neck stiffness.  Skin: Negative for itching and rash.  Neurological: Negative for dizziness, extremity weakness, gait problem, headaches, light-headedness and seizures.  Hematological: Negative for adenopathy. Does not bruise/bleed easily.  Psychiatric/Behavioral: Negative for confusion, depression and sleep disturbance. The patient is not nervous/anxious.     PHYSICAL EXAMINATION:  Blood pressure (!) 162/81, pulse 74, temperature 98.2 F (36.8 C), temperature source Oral, resp. rate 16, weight 223 lb 3.2 oz (101.2 kg), SpO2 100 %.  ECOG PERFORMANCE STATUS: 1  Physical Exam  Constitutional: Oriented to person, place, and time and well-developed, well-nourished, and in no distress.   HENT:  Head: Normocephalic and atraumatic.  Mouth/Throat: Oropharynx is clear and moist. No oropharyngeal exudate.  Eyes: Conjunctivae are normal. Right eye exhibits no discharge. Left eye exhibits no discharge. No scleral icterus.  Neck: Normal range of motion. Neck supple.  Cardiovascular: Normal rate, regular rhythm, normal heart sounds and intact distal pulses.   Pulmonary/Chest: Effort normal and breath sounds normal. No respiratory distress. No wheezes. No rales.  Abdominal: Soft. Bowel sounds are normal. Exhibits no distension and no mass. There is no tenderness.  Musculoskeletal: Normal range of motion. Exhibits no edema.  Lymphadenopathy:    No cervical adenopathy.  Neurological: Alert and oriented to person, place, and time. Exhibits normal muscle tone. Gait normal. Coordination normal.  Skin: Skin is warm and dry. No rash noted. Not diaphoretic. No erythema. No pallor.  Psychiatric: Mood, memory and judgment normal.  Vitals reviewed.  LABORATORY DATA: Lab Results  Component Value Date   WBC 9.6 08/01/2022   HGB 15.9 08/01/2022   HCT 43.1 08/01/2022   MCV 90.2 08/01/2022   PLT 227 08/01/2022      Chemistry       Component Value Date/Time   NA 132 (L) 08/01/2022 1042   NA 138 07/01/2019 0839   K 4.1 08/01/2022 1042   CL 100 08/01/2022 1042   CO2 26 08/01/2022 1042   BUN 11 08/01/2022 1042   BUN 8 07/01/2019 0839   CREATININE 0.82 08/01/2022 1042      Component Value Date/Time   CALCIUM 9.3 08/01/2022 1042   ALKPHOS 53 08/01/2022 1042   AST 22 08/01/2022 1042  ALT 29 08/01/2022 1042   BILITOT 0.9 08/01/2022 1042       RADIOGRAPHIC STUDIES: CT CHEST W CONTRAST  Result Date: 08/01/2022 CLINICAL DATA:  New diagnosis of esophageal cancer/esophageal mass. * Tracking Code: BO * EXAM: CT CHEST AND ABDOMEN WITH CONTRAST TECHNIQUE: Multidetector CT imaging of the chest and abdomen was performed following the standard protocol during bolus administration of intravenous contrast. RADIATION DOSE REDUCTION: This exam was performed according to the departmental dose-optimization program which includes automated exposure control, adjustment of the mA and/or kV according to patient size and/or use of iterative reconstruction technique. CONTRAST:  118m OMNIPAQUE IOHEXOL 300 MG/ML  SOLN COMPARISON:  Chest CT of 07/23/2022 FINDINGS: CT CHEST FINDINGS Cardiovascular: Aortic atherosclerosis. Normal heart size, without pericardial effusion. Possible left ventricular hypertrophy. Three vessel coronary artery calcification. probable LAD stent. No central pulmonary embolism, on this non-dedicated study. Mediastinum/Nodes: No supraclavicular adenopathy. No mediastinal or hilar adenopathy. Tiny hiatal hernia. The esophageal primary is identified as an area of wall thickening and subtle hyperenhancement distally including on 44/2. No esophageal obstruction. Lungs/Pleura: No pleural fluid. Mild centrilobular emphysema. 3 mm left upper lobe calcified granuloma. Noncalcified subpleural 2-3 mm left upper lobe pulmonary nodule on 30/4 is most likely a subpleural lymph node. Musculoskeletal: No acute osseous abnormality. CT  ABDOMEN FINDINGS Hepatobiliary: Normal liver. Possible tiny gallstone or stones. No biliary duct dilatation or acute inflammation. Pancreas: Normal, without mass or ductal dilatation. Spleen: Normal in size, without focal abnormality. Adrenals/Urinary Tract: Normal adrenal glands. Upper pole left renal subcentimeter low-density lesion is technically too small to characterize but favored to represent a cyst . In the absence of clinically indicated signs/symptoms require(s) no independent follow-up. Partially exophytic lower pole left renal 2.2 cm heterogeneous hypoattenuating mass demonstrates apparent enhancement on portal venous phase relative to renal delayed images. No hydronephrosis. Stomach/Bowel: Normal remainder of the stomach. Normal colon and terminal ileum. Normal small bowel. Vascular/Lymphatic: Aortic atherosclerosis. Patent renal veins. Gastrohepatic ligament node measures 1.3 by 2.4 cm on 59/2. Other: No ascites.  No evidence of omental or peritoneal disease. Musculoskeletal: No acute osseous abnormality. IMPRESSION: 1. Distal esophageal primary with isolated gastrohepatic ligament adenopathy, highly suspicious for nodal metastasis. 2. Lower pole left renal 2.2 cm mass, highly suspicious for renal cell carcinoma. No renal vein involvement or abdominal retroperitoneal adenopathy. 3.  Tiny hiatal hernia. 4.  Age advanced coronary artery atherosclerosis. 5. Aortic atherosclerosis (ICD10-I70.0) and emphysema (ICD10-J43.9). These results will be called to the ordering clinician or representative by the Radiologist Assistant, and communication documented in the PACS or [employer] Electronically Signed   By: KAbigail MiyamotoM.D.   On: 08/01/2022 08:37   CT ABDOMEN W CONTRAST  Result Date: 08/01/2022 CLINICAL DATA:  New diagnosis of esophageal cancer/esophageal mass. * Tracking Code: BO * EXAM: CT CHEST AND ABDOMEN WITH CONTRAST TECHNIQUE: Multidetector CT imaging of the chest and abdomen was performed  following the standard protocol during bolus administration of intravenous contrast. RADIATION DOSE REDUCTION: This exam was performed according to the departmental dose-optimization program which includes automated exposure control, adjustment of the mA and/or kV according to patient size and/or use of iterative reconstruction technique. CONTRAST:  1038mOMNIPAQUE IOHEXOL 300 MG/ML  SOLN COMPARISON:  Chest CT of 07/23/2022 FINDINGS: CT CHEST FINDINGS Cardiovascular: Aortic atherosclerosis. Normal heart size, without pericardial effusion. Possible left ventricular hypertrophy. Three vessel coronary artery calcification. probable LAD stent. No central pulmonary embolism, on this non-dedicated study. Mediastinum/Nodes: No supraclavicular adenopathy. No mediastinal or hilar adenopathy. Tiny hiatal  hernia. The esophageal primary is identified as an area of wall thickening and subtle hyperenhancement distally including on 44/2. No esophageal obstruction. Lungs/Pleura: No pleural fluid. Mild centrilobular emphysema. 3 mm left upper lobe calcified granuloma. Noncalcified subpleural 2-3 mm left upper lobe pulmonary nodule on 30/4 is most likely a subpleural lymph node. Musculoskeletal: No acute osseous abnormality. CT ABDOMEN FINDINGS Hepatobiliary: Normal liver. Possible tiny gallstone or stones. No biliary duct dilatation or acute inflammation. Pancreas: Normal, without mass or ductal dilatation. Spleen: Normal in size, without focal abnormality. Adrenals/Urinary Tract: Normal adrenal glands. Upper pole left renal subcentimeter low-density lesion is technically too small to characterize but favored to represent a cyst . In the absence of clinically indicated signs/symptoms require(s) no independent follow-up. Partially exophytic lower pole left renal 2.2 cm heterogeneous hypoattenuating mass demonstrates apparent enhancement on portal venous phase relative to renal delayed images. No hydronephrosis. Stomach/Bowel: Normal  remainder of the stomach. Normal colon and terminal ileum. Normal small bowel. Vascular/Lymphatic: Aortic atherosclerosis. Patent renal veins. Gastrohepatic ligament node measures 1.3 by 2.4 cm on 59/2. Other: No ascites.  No evidence of omental or peritoneal disease. Musculoskeletal: No acute osseous abnormality. IMPRESSION: 1. Distal esophageal primary with isolated gastrohepatic ligament adenopathy, highly suspicious for nodal metastasis. 2. Lower pole left renal 2.2 cm mass, highly suspicious for renal cell carcinoma. No renal vein involvement or abdominal retroperitoneal adenopathy. 3.  Tiny hiatal hernia. 4.  Age advanced coronary artery atherosclerosis. 5. Aortic atherosclerosis (ICD10-I70.0) and emphysema (ICD10-J43.9). These results will be called to the ordering clinician or representative by the Radiologist Assistant, and communication documented in the PACS or [employer]. Electronically Signed   By: Abigail Miyamoto M.D.   On: 08/01/2022 08:37   CT CHEST LUNG CA SCREEN LOW DOSE W/O CM  Result Date: 07/24/2022 CLINICAL DATA:  Current smoker, 59 pack-year history. EXAM: CT CHEST WITHOUT CONTRAST LOW-DOSE FOR LUNG CANCER SCREENING TECHNIQUE: Multidetector CT imaging of the chest was performed following the standard protocol without IV contrast. RADIATION DOSE REDUCTION: This exam was performed according to the departmental dose-optimization program which includes automated exposure control, adjustment of the mA and/or kV according to patient size and/or use of iterative reconstruction technique. COMPARISON:  None Available. FINDINGS: Cardiovascular: Atherosclerotic calcification of the aorta, aortic valve and coronary arteries. Heart size normal. No pericardial effusion. Mediastinum/Nodes: No pathologically enlarged mediastinal or axillary lymph nodes. Hilar regions are difficult to definitively evaluate without IV contrast. Distal esophageal wall thickening. Small hiatal hernia. Lungs/Pleura:  Centrilobular emphysema. Calcified granuloma. 3.2 mm subpleural left upper lobe nodule. No suspicious pulmonary nodules. No pleural fluid. Airway is unremarkable. Upper Abdomen: Visualized portions of the liver, gallbladder, adrenal glands and right kidney are unremarkable. Subcentimeter exophytic lesion off the upper pole left kidney, too small to characterize. No specific follow-up necessary. Visualized portions the spleen, pancreas, stomach and bowel are unremarkable with the exception of the aforementioned small hiatal hernia. Musculoskeletal: No worrisome lytic or sclerotic lesions. IMPRESSION: 1. Lung-RADS 2, benign appearance or behavior. Continue annual screening with low-dose chest CT without contrast in 12 months. 2. Distal esophageal wall thickening can be seen with gastroesophageal reflux disease. Difficult to exclude a mass. Please correlate clinically and consider esophagram evaluation as clinically indicated. 3. Aortic atherosclerosis (ICD10-I70.0). Coronary artery calcification. 4.  Emphysema (ICD10-J43.9). Electronically Signed   By: Lorin Picket M.D.   On: 07/24/2022 13:49   ASSESSMENT: This is a very pleasant 51 year old Caucasian male with:   Esophageal Adenocarcinoma of the distal esophagus. Diagnosed in September  2023.  Pending further staging workup, likely locally advanced disease. -The patient presenting with some dysphagia with primarily solids over the last few months. Endorses slower passage of food in esophagus.  -EGD under the care of Dr. Enis Gash on 07/25/22 revealed a 4 cm mass in the lower third of the esophagus.  The mass was nonobstructing -The final pathology ([id number]) was consistent with invasive moderately differentiated adenocarcinoma.  -Have requested MMR and PD-L1 today -Staging CT CAP 07/30/22 showed distal esophageal primary with isolated gastrohepatic ligament adenopathy, highly suspicious for nodal metastasis.  Incidental 2.2 cm left renal mass, highly  suspicious for renal cell carcinoma. -The patient was seen with Dr. Burr Medico today.  Dr. Burr Medico had a lengthy discussion today with the patient about his current condition and recommended treatment options. -Dr. Burr Medico discussed that we need imaging to complete the staging work-up with a PET scan.  He is scheduled for PET scan at [facility] on Monday, 08/04/2022. -Thus far, it appears that this is likely locally advanced esophageal cancer.  Dr. Burr Medico discuss treatment treatment for locally advanced esophageal cancer.  Dr. Burr Medico recommends concurrent chemoradiation followed by possible surgical resection 4-6 weeks after completing the radiation.  As long as there is no additional identified areas of disease on his upcoming PET scan.  -The patient was referred to CT surgery and will have an upcoming appointment with Dr. Kipp Brood.  - Dr. Burr Medico discussed that Dr. Kipp Brood may want any EUS for staging.  Dr. Burr Medico will reach out to Dr. Doyne Keel team, to see if an EUS can be arranged.    Discussed the logistics of chemoradiation.  Chemo is typically given once a week (on Mondays) for approximately 5.5-6 weeks. Radiation is given Monday through Friday.  -Adverse side effects of chemotherapy were discussed including but not limited to fatigue, nausea, vomiting, myelosuppression, risk of infection, peripheral neuropathy, kidney, and liver dysfunction. -We will arrange for chemo education class prior to starting his first cycle of treatment.  The patient is eager to start treatment.  He would like [place] to tentatively make appointments for the start date of 08/11/2022.  The patient understands that we may have to delay the start date to 08/18/2022 based on if he can complete the work-up and when he can start radiation therapy.  -He has met with our nurse navigator, Ihor Gully, today who will monitor his appointments and work-up. -I have placed a referral to radiation oncology (Dr. Lisbeth Renshaw)  -Patient is scheduled to have a second  opinion at [facility] on Monday, 08/04/2022.  The patient will let us know either way if he decides to proceed with his care at the [place] or [place]. -I have sent his antiemetics to the pharmacy -The patient was wondering if he can get his vaccines.  Dr. Burr Medico encouraged the patient to get his flu vaccine.  Also discussed in the future getting his RSV and COVID booster. -Obtain baseline labs today (CBC, CMP, Ferritin, and CEA) -We will discuss the patient's case at the GI and thoracic multidisciplinary conference next week.   2. Dysphagia, H. Pylori, and Barrettes esophagitis  -The mass is 4 cm and non-obstructing -The patient is able to eat a normal diet as long as he cuts his food in small pieces and drinks plenty of fluids in between bites.  -We are hopeful he will not require a feeding tube. Dr. Burr Medico briefly mentioned that in worst case scenario, if unable to have adequate nutrition, some patient's  may require feeding tube.  -Dr. Burr Medico recommends nutritional consult as some patients develop radiation induced esophagitis during concurrent chemo/radiation. I have placed the referral.  -Dr. Burr Medico encouraged high-calorie high-protein diet.  Directed to avoid greasy and spicy food -Currently on protonix 40 mg BID  -Currently undergoing treatment for H. Pylori.   3.  Incidental 2.2 cm left renal mass, suspicious for renal cell carcinoma -This was incidentally noted on the patient's recent low-dose lung cancer screening.  Redemonstrated on CT chest and abdomen performed on 07/30/2022  -Will assess on upcoming PET scan for hypermetabolic activity -Referral made to [facility]  4. Tobacco abuse and alcohol abuse -He estimates he would drink a case of beer per week. -He stopped drinking alcohol completely approximately 1 week ago -He is working on smoking cessation.  Using NicoDerm patches.  I sent him a prescription and NicoDerm patches today  5. Social   -Patient works as a Designer, industrial/product.  Dependent with his activities of daily living.  Lives with his wife, Inez Catalina.  He has an adult daughter. -Dr. Burr Medico discussed he may need to take time off of work during the course of his treatment, at least the day of treatment and possibly the following day.  He may also require taking days off towards the end of radiation treatment. -Thus far, his job has been accommodating.  I gave the patient information on how to contact our managed care office.  We be happy to fill out any forms required by his employer to allow him to take time off while he undergoes his medical treatment. I also placed referral to social worker   PLAN: -Baseline CBC, CMP, ferritin, and CEA today -PET scan at [facility] on 08/04/2022 -Referral to nutritionist and social worker -Referral to radiation oncology -See CT surgery (Dr. Kipp Brood) for consultation -Discuss at GI and thoracic conference next week -Discussed that thoracic tumor board and GI tumor board -Reach out to GI to see if able to perform EUS -Antiemetics and NicoDerm patches sent to pharmacy  -Requested PD-L1 and MMR -Follow-up on the first day of chemo -Given information on FMLA/managed care office -[facility]erral to [facility] for left renal mass    The patient voices understanding of current disease status and treatment options and is in agreement with the current care plan.  All questions were answered. The patient knows to call the clinic with any problems, questions or concerns. We can certainly see the patient much sooner if necessary.  Thank you so much for allowing me to participate in the care of Jeffery Walker. I will continue to follow up the patient with you and assist in his care.   Disclaimer: This note was dictated with voice recognition software. Similar sounding words can inadvertently be transcribed and may not be corrected upon review.   Tamika Shropshire L Greysen Swanton August 01, 2022, 1:27  PM  Addendum  51 year old gentleman with past medical history of hypertension, coronary artery disease, sleep apnea, GERD, presented with dysphagia with solid food, but still able to eat most food and no weight loss.  I reviewed his endoscopy findings, the biopsy results and the CT scan findings with patient and his wife in details.  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  No distant metastasis on CT scan.  He is scheduled for PET scan at [facility] on Monday.  This is likely locally advanced esophageal cancer, I recommended neoadjuvant chemotherapy weekly carboplatin and Taxol  with concurrent radiation, followed by surgery, and adjuvant nivolumab if he has residual disease after neoadjuvant treatment. Will request MMR and PD-L1 on his biopsy sample. We also discussed nutrition support and social support. All questions were answered. We will happy to see him back if he decides to get treatment in our cancer center. Will refer him to rad/onc Dr. Lisbeth Renshaw. He has appointment with thoracic surgeon Dr. Kipp Brood next week. Will discuss gust his case in our thoracic tumor board next week.  Truitt Merle  08/01/2022

## 2022-08-01 ENCOUNTER — Telehealth: Payer: Self-pay

## 2022-08-01 ENCOUNTER — Other Ambulatory Visit: Payer: Self-pay

## 2022-08-01 ENCOUNTER — Ambulatory Visit (HOSPITAL_COMMUNITY): Payer: 59

## 2022-08-01 ENCOUNTER — Encounter: Payer: Self-pay | Admitting: Physician Assistant

## 2022-08-01 ENCOUNTER — Inpatient Hospital Stay: Payer: 59 | Attending: Hematology | Admitting: Physician Assistant

## 2022-08-01 ENCOUNTER — Encounter: Payer: 59 | Admitting: Thoracic Surgery (Cardiothoracic Vascular Surgery)

## 2022-08-01 ENCOUNTER — Inpatient Hospital Stay: Payer: 59

## 2022-08-01 VITALS — BP 162/81 | HR 74 | Temp 98.2°F | Resp 16 | Wt 223.2 lb

## 2022-08-01 DIAGNOSIS — F17210 Nicotine dependence, cigarettes, uncomplicated: Secondary | ICD-10-CM | POA: Insufficient documentation

## 2022-08-01 DIAGNOSIS — Z79899 Other long term (current) drug therapy: Secondary | ICD-10-CM | POA: Insufficient documentation

## 2022-08-01 DIAGNOSIS — N2889 Other specified disorders of kidney and ureter: Secondary | ICD-10-CM

## 2022-08-01 DIAGNOSIS — C155 Malignant neoplasm of lower third of esophagus: Secondary | ICD-10-CM | POA: Diagnosis present

## 2022-08-01 LAB — CMP (CANCER CENTER ONLY)
ALT: 29 U/L (ref 0–44)
AST: 22 U/L (ref 15–41)
Albumin: 4.5 g/dL (ref 3.5–5.0)
Alkaline Phosphatase: 53 U/L (ref 38–126)
Anion gap: 6 (ref 5–15)
BUN: 11 mg/dL (ref 6–20)
CO2: 26 mmol/L (ref 22–32)
Calcium: 9.3 mg/dL (ref 8.9–10.3)
Chloride: 100 mmol/L (ref 98–111)
Creatinine: 0.82 mg/dL (ref 0.61–1.24)
GFR, Estimated: >60 mL/min (ref >60)
Glucose, Bld: 113 mg/dL — ABNORMAL HIGH (ref 70–99)
Potassium: 4.1 mmol/L (ref 3.5–5.1)
Sodium: 132 mmol/L — ABNORMAL LOW (ref 135–145)
Total Bilirubin: 0.9 mg/dL (ref 0.3–1.2)
Total Protein: 7.2 g/dL (ref 6.5–8.1)

## 2022-08-01 LAB — CBC WITH DIFFERENTIAL (CANCER CENTER ONLY)
Abs Immature Granulocytes: 0.02 10*3/uL (ref 0.00–0.07)
Basophils Absolute: 0 10*3/uL (ref 0.0–0.1)
Basophils Relative: 0 %
Eosinophils Absolute: 0.1 10*3/uL (ref 0.0–0.5)
Eosinophils Relative: 1 %
HCT: 43.1 % (ref 39.0–52.0)
Hemoglobin: 15.9 g/dL (ref 13.0–17.0)
Immature Granulocytes: 0 %
Lymphocytes Relative: 26 %
Lymphs Abs: 2.5 10*3/uL (ref 0.7–4.0)
MCH: 33.3 pg (ref 26.0–34.0)
MCHC: 36.9 g/dL — ABNORMAL HIGH (ref 30.0–36.0)
MCV: 90.2 fL (ref 80.0–100.0)
Monocytes Absolute: 0.8 10*3/uL (ref 0.1–1.0)
Monocytes Relative: 9 %
Neutro Abs: 6.1 10*3/uL (ref 1.7–7.7)
Neutrophils Relative %: 64 %
Platelet Count: 227 10*3/uL (ref 150–400)
RBC: 4.78 MIL/uL (ref 4.22–5.81)
RDW: 12.5 % (ref 11.5–15.5)
WBC Count: 9.6 10*3/uL (ref 4.0–10.5)
nRBC: 0 % (ref 0.0–0.2)

## 2022-08-01 LAB — FERRITIN: Ferritin: 82 ng/mL (ref 24–336)

## 2022-08-01 LAB — CEA (ACCESS): CEA (CHCC): 3.89 ng/mL (ref 0.00–5.00)

## 2022-08-01 MED ORDER — NICOTINE 21 MG/24HR TD PT24: 21.0000 mg | MEDICATED_PATCH | Freq: Every day | TRANSDERMAL | 1 refills | Status: DC

## 2022-08-01 MED ORDER — ONDANSETRON HCL 8 MG PO TABS: 8.0000 mg | ORAL_TABLET | Freq: Three times a day (TID) | ORAL | 2 refills | Status: DC | PRN

## 2022-08-01 MED ORDER — PROCHLORPERAZINE MALEATE 10 MG PO TABS: 10.0000 mg | ORAL_TABLET | Freq: Four times a day (QID) | ORAL | 2 refills | Status: DC | PRN

## 2022-08-01 NOTE — Telephone Encounter (Signed)
Referral has been sent to:   [facility] [place] [address] [place], [place]   [phone] [place]: [phone]   Referral Insurance/Demographics CT scan   Confirmation was received.

## 2022-08-01 NOTE — Progress Notes (Signed)
I met with Jeffery Walker and his wife after  his consultation with [name] Heilingoetter, PA-C and Dr Burr Medico.  I explained my role as a nurse navigator and provided my contact information. I explained the services provided at [facility] and provided written information.  I explained the alight grant and let  him know one of the financial advisors will reach out to  him at the time of his chemo education class. I briefly reviewed common side effects associated with the recommended chemotherapy regimen, Carbo/taxol.  I told him that he will be scheduled for chemotherapy education class prior to receiving chemotherapy.  I told him our schedulers will call him with those appts.  He will next week and let us know if he will seek treatment here or at [facility].  All questions were answered. they verbalized understanding.

## 2022-08-01 NOTE — Telephone Encounter (Signed)
Progress note has been faxed to location details mentioned in previous note.

## 2022-08-01 NOTE — Patient Instructions (Signed)
Summary: -It was nice meeting you both today.  I know we discussed a lot of important information.  Here is a summary of the main take away points moving forward. -When Dr. Havery Moros performed the upper endoscopy, he saw a spot about 4 cm in the lower part of your esophagus.  Your esophagus brings food from your mouth to your stomach.  He took a piece (biopsy) of this spot in the esophagus and it is consistent with esophageal cancer called adenocarcinoma. -He had a CT scan a few days ago.  Thus far, it appears that this is locally advanced disease.  There is a spot in the esophagus where this started as well as 1 lymph node nearby that looks like it is also likely involved.  We do want you to have the PET scan which is a more sensitive test to ensure there are no other spots or lymph nodes that the cancer has spread to.  This scan includes your head all the way down to your thighs.  Areas that are involved with cancer will light up bright on the scan.  -Your CT scan of your lungs as well as the CT scan you had 2 days ago incidentally noted a spot on the kidney.  I have placed a referral to [facility] regarding this spot on the kidney. -If the PET scan confirms that this is locally advanced disease, treatment would be chemo/radiation for approximately 5 and half to 6 weeks.  We would then give you 4 to 6 weeks to recover before proceeding with surgery.  If after surgery there is any residual disease, Dr. Burr Medico may recommend immunotherapy for 1 year.  Of note, I am going to reach out to the pathology lab and asked them to run some more test on the biopsy to check for markers to see how well he would likely respond to immunotherapy.  [facility] may also want these tests and they could request the results or we can fax them to their office if they need this. -We covered a lot of important information at your appointment today regarding what the treatment plan is moving forward. Here are the the main points that  were discussed at your office visit with [place] today:  -The treatment that you will receive consists of two chemotherapy drugs called Carboplatin and Paclitaxel (also called Taxol) -We are planning on starting your treatment on ~08/11/22 as long as you are radiation is also starting that week.  If your radiation has not started yet, then more likely we will start to the week of 08/18/2022.   But before your start your treatment, I would like you to attend a Chemotherapy Education Class. This involves having you sit down with one of our nurse educators. She will discuss with you one-on-one more details about your treatment as well as general information about resources here at the [facility] treatment will be given every week on Mondays for about 6 weeks or so (as long as you are also receiving radiation). We will check your labs (blood work) once a week . Dr. Burr Medico, her NP Regan Rakers, or I will see you every treatment just to make sure that you are doing well and that everything is on track.   Medications:  -Compazine was sent to your pharmacy. This medication is for nausea. You may take this every 6 hours as needed if you feel nausous.  -Zofran was also sent to your pharmacy.  This is also a second medication  for nausea.  You get a strong version of Zofran on the days of your chemotherapy.  Therefore, I want to draw your attention to this prescription.  If you look at the bottle, this can be taken every 8 hours as needed for nausea and vomiting, starting after the third day after chemotherapy.  The reason why is because if you take Zofran within the first 1 to 3 days after chemotherapy, this medication is already in your system and it will not be very effective in those first 3 days if you need additional control of your nausea -I have sent NicoDerm patches to your pharmacy.  Referrals -I will place the referral to radiation oncology (Dr. Lisbeth Renshaw) to meet with you to discuss starting radiation  treatment. Please be on the lookout for a phone call from them to schedule a consultation.  Dr. Lisbeth Renshaw is located in the basement of this building.  Radiation appointments are daily Monday through Friday for approximately 5-1/2 to 6 weeks.  Your first visit with Dr. Lisbeth Renshaw will just be a consultation so he can discuss radiation in more depth. -[facility] -Nutritionist to follow you to ensure you are getting adequate nutrition throughout the course of treatment -You were given information about FMLA.  We are happy to fill out this paperwork for you.  I have also placed a referral to the social worker to see if she has any other recommendations.  Dr. Burr Medico would likely recommend that you take at least the day of treatment and the day after off work.  Sometimes people can have discomfort swallowing towards the end of their course of radiation and you may need to take more time off of work towards the end of your treatment. -Dr. Burr Medico will reach out to Dr. Doyne Keel office to see if him or one of his partners can perform something called an EUS.  This is a lot like the upper endoscopy you had but they go down with an ultrasound instead.  This helps some assess how deep into the wall of the esophagus the tumor is invading.  This is also important for the staging work-up.  Dr. Kipp Brood likes to have this information before determining if you are a candidate for surgery.   Follow Up:  -We will see you back for a follow up visit around 10/9 if your radiation appointments are set up at this time.  If radiation is not going to begin the week of 10/9, then we will start treatment on the week of 10/16.  We will see you every week when you come in for chemotherapy -If you decide to get your care at [facility], please just let us know we will help cancel your appointments.

## 2022-08-02 ENCOUNTER — Encounter: Payer: Self-pay | Admitting: Hematology

## 2022-08-02 ENCOUNTER — Encounter: Payer: Self-pay | Admitting: Physician Assistant

## 2022-08-02 NOTE — Progress Notes (Signed)
START ON PATHWAY REGIMEN - Gastroesophageal     Administer weekly during RT:     Paclitaxel      Carboplatin   **Always confirm dose/schedule in your pharmacy ordering system**  Patient Characteristics: Esophageal & GE Junction, Adenocarcinoma, Preoperative or Nonsurgical Candidate, M0 (Clinical Staging), cT2 or Higher or cN+, Surgical Candidate (Up to cT4a) - Preoperative Therapy, Esophageal Therapeutic Status: Preoperative or Nonsurgical Candidate, M0 (Clinical Staging) Histology: Adenocarcinoma Disease Classification: Esophageal AJCC Grade: G2 AJCC 8 Stage Grouping: Unknown AJCC T Category: cTX AJCC N Category: cN1 AJCC M Category: cM0 Intent of Therapy: Curative Intent, Discussed with Patient

## 2022-08-03 ENCOUNTER — Other Ambulatory Visit: Payer: Self-pay

## 2022-08-05 ENCOUNTER — Other Ambulatory Visit: Payer: Self-pay

## 2022-08-05 ENCOUNTER — Ambulatory Visit
Admission: RE | Admit: 2022-08-05 | Discharge: 2022-08-05 | Disposition: A | Discharge status: Home / Self Care | Payer: Self-pay | Source: Ambulatory Visit | Attending: Hematology | Admitting: Hematology

## 2022-08-05 DIAGNOSIS — C155 Malignant neoplasm of lower third of esophagus: Secondary | ICD-10-CM

## 2022-08-05 NOTE — Progress Notes (Signed)
Faxed request to [facility] to have 08/04/2022 PET scan images pushed to powershare. Faxed request to [facility] for MMR and PDL-1 testing.

## 2022-08-06 ENCOUNTER — Other Ambulatory Visit: Payer: Self-pay

## 2022-08-06 ENCOUNTER — Telehealth: Payer: Self-pay | Admitting: Radiation Oncology

## 2022-08-06 NOTE — Telephone Encounter (Signed)
Called patient to R/S consultation w. Dr. Lisbeth Renshaw scheduled 10/10. No answer, LVM for a return call.

## 2022-08-06 NOTE — Progress Notes (Signed)
The proposed treatment discussed in conference is for discussion purpose only and is not a binding recommendation.  The patients have not been physically examined, or presented with their treatment options.  Therefore, final treatment plans cannot be decided.  

## 2022-08-07 ENCOUNTER — Other Ambulatory Visit: Payer: Self-pay | Admitting: *Deleted

## 2022-08-07 ENCOUNTER — Other Ambulatory Visit: Payer: Self-pay | Admitting: Hematology

## 2022-08-07 ENCOUNTER — Telehealth: Payer: Self-pay | Admitting: Radiation Oncology

## 2022-08-07 ENCOUNTER — Ambulatory Visit: Payer: 59 | Admitting: Hematology

## 2022-08-07 DIAGNOSIS — C155 Malignant neoplasm of lower third of esophagus: Secondary | ICD-10-CM

## 2022-08-07 NOTE — Progress Notes (Signed)
Request for MMR and PEL-1 testing on biopsy specimen dated 07/29/2022 faxed to [facility] attn: Dr Vic Ripper.

## 2022-08-07 NOTE — Progress Notes (Signed)
I left a vm for Mr Gibeault to return my call.

## 2022-08-07 NOTE — Progress Notes (Signed)
The proposed treatment discussed in conference is for discussion purpose only and is not a binding recommendation.  The patients have not been physically examined, or presented with their treatment options.  Therefore, final treatment plans cannot be decided.  

## 2022-08-07 NOTE — Telephone Encounter (Signed)
Called patient to R/S consultation w. Dr.Moody. No answer, LVM for a return call.

## 2022-08-07 NOTE — Progress Notes (Signed)
I left a vm for Jeffery Walker to return my call.

## 2022-08-08 ENCOUNTER — Other Ambulatory Visit: Payer: Self-pay

## 2022-08-10 ENCOUNTER — Other Ambulatory Visit: Payer: Self-pay

## 2022-08-11 ENCOUNTER — Inpatient Hospital Stay: Payer: 59

## 2022-08-12 ENCOUNTER — Ambulatory Visit: Payer: 59 | Admitting: Radiation Oncology

## 2022-08-12 ENCOUNTER — Other Ambulatory Visit: Payer: Self-pay

## 2022-08-12 ENCOUNTER — Ambulatory Visit: Payer: 59

## 2022-08-13 ENCOUNTER — Other Ambulatory Visit: Payer: Self-pay

## 2022-08-13 ENCOUNTER — Telehealth (HOSPITAL_BASED_OUTPATIENT_CLINIC_OR_DEPARTMENT_OTHER): Payer: Self-pay

## 2022-08-15 ENCOUNTER — Other Ambulatory Visit: Payer: 59

## 2022-08-19 ENCOUNTER — Ambulatory Visit: Payer: 59

## 2022-08-19 ENCOUNTER — Ambulatory Visit: Payer: 59 | Admitting: Physician Assistant

## 2022-08-19 ENCOUNTER — Encounter: Payer: Self-pay | Admitting: Internal Medicine

## 2022-08-19 ENCOUNTER — Other Ambulatory Visit: Payer: 59

## 2022-08-20 ENCOUNTER — Telehealth: Payer: 59 | Admitting: Dietician

## 2022-08-20 ENCOUNTER — Other Ambulatory Visit: Payer: Self-pay | Admitting: Internal Medicine

## 2022-08-26 ENCOUNTER — Telehealth: Payer: Self-pay

## 2022-08-26 ENCOUNTER — Other Ambulatory Visit: Payer: Self-pay | Admitting: Internal Medicine

## 2022-08-26 NOTE — Telephone Encounter (Signed)
MyChart message sent to patient with reminder.

## 2022-08-26 NOTE — Telephone Encounter (Signed)
-----   Message from Yevette Edwards, RN sent at 07/29/2022  3:09 PM EDT ----- Regarding: PPI hold Hold Protonix, H. Pylor stool test due on or after 11/7

## 2022-08-26 NOTE — Telephone Encounter (Signed)
Patient reviewed MyChart message. Last read by [name] at  9:47 AM on 08/26/2022.

## 2022-08-28 ENCOUNTER — Other Ambulatory Visit: Payer: Self-pay | Admitting: Internal Medicine

## 2022-09-05 IMAGING — DX DG CHEST 2V
2 series · 2 of 2 positions shown · non-contrast
Comparison: 03/29/2019

CLINICAL DATA: Productive cough

EXAM:
CHEST - 2 VIEW

[chest pa]
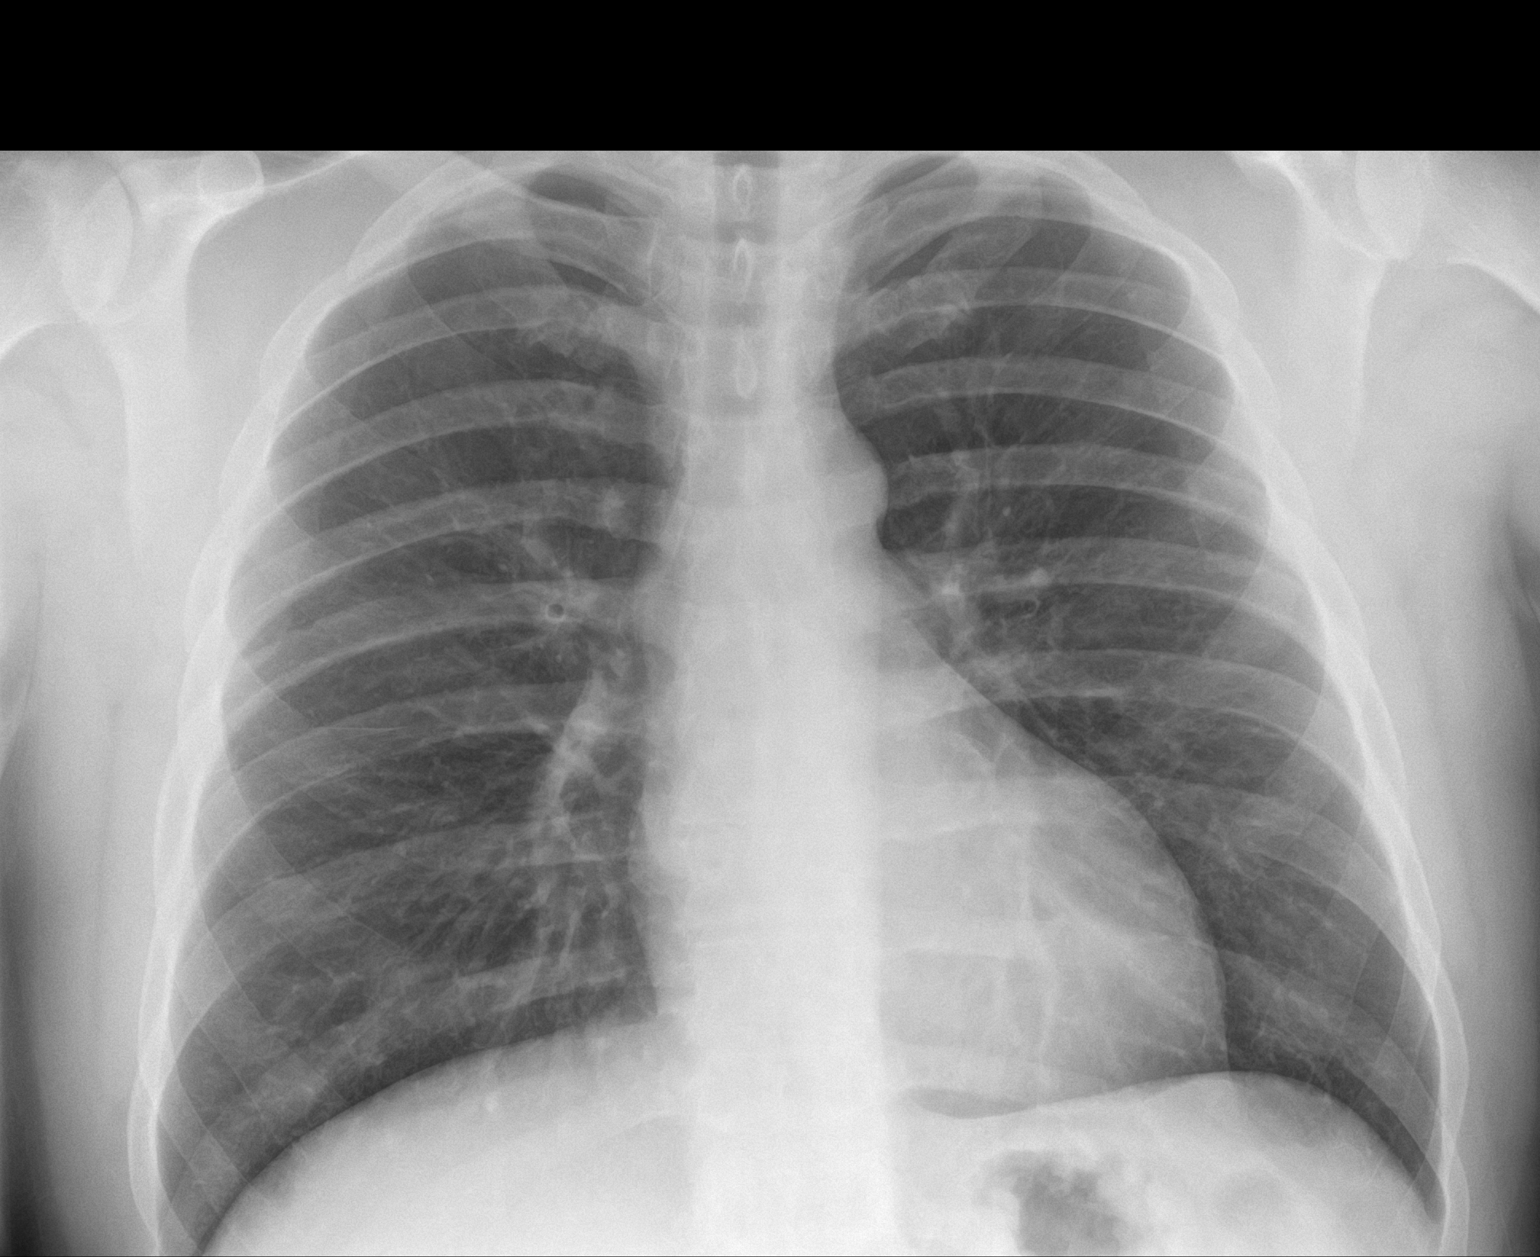

[chest lat]
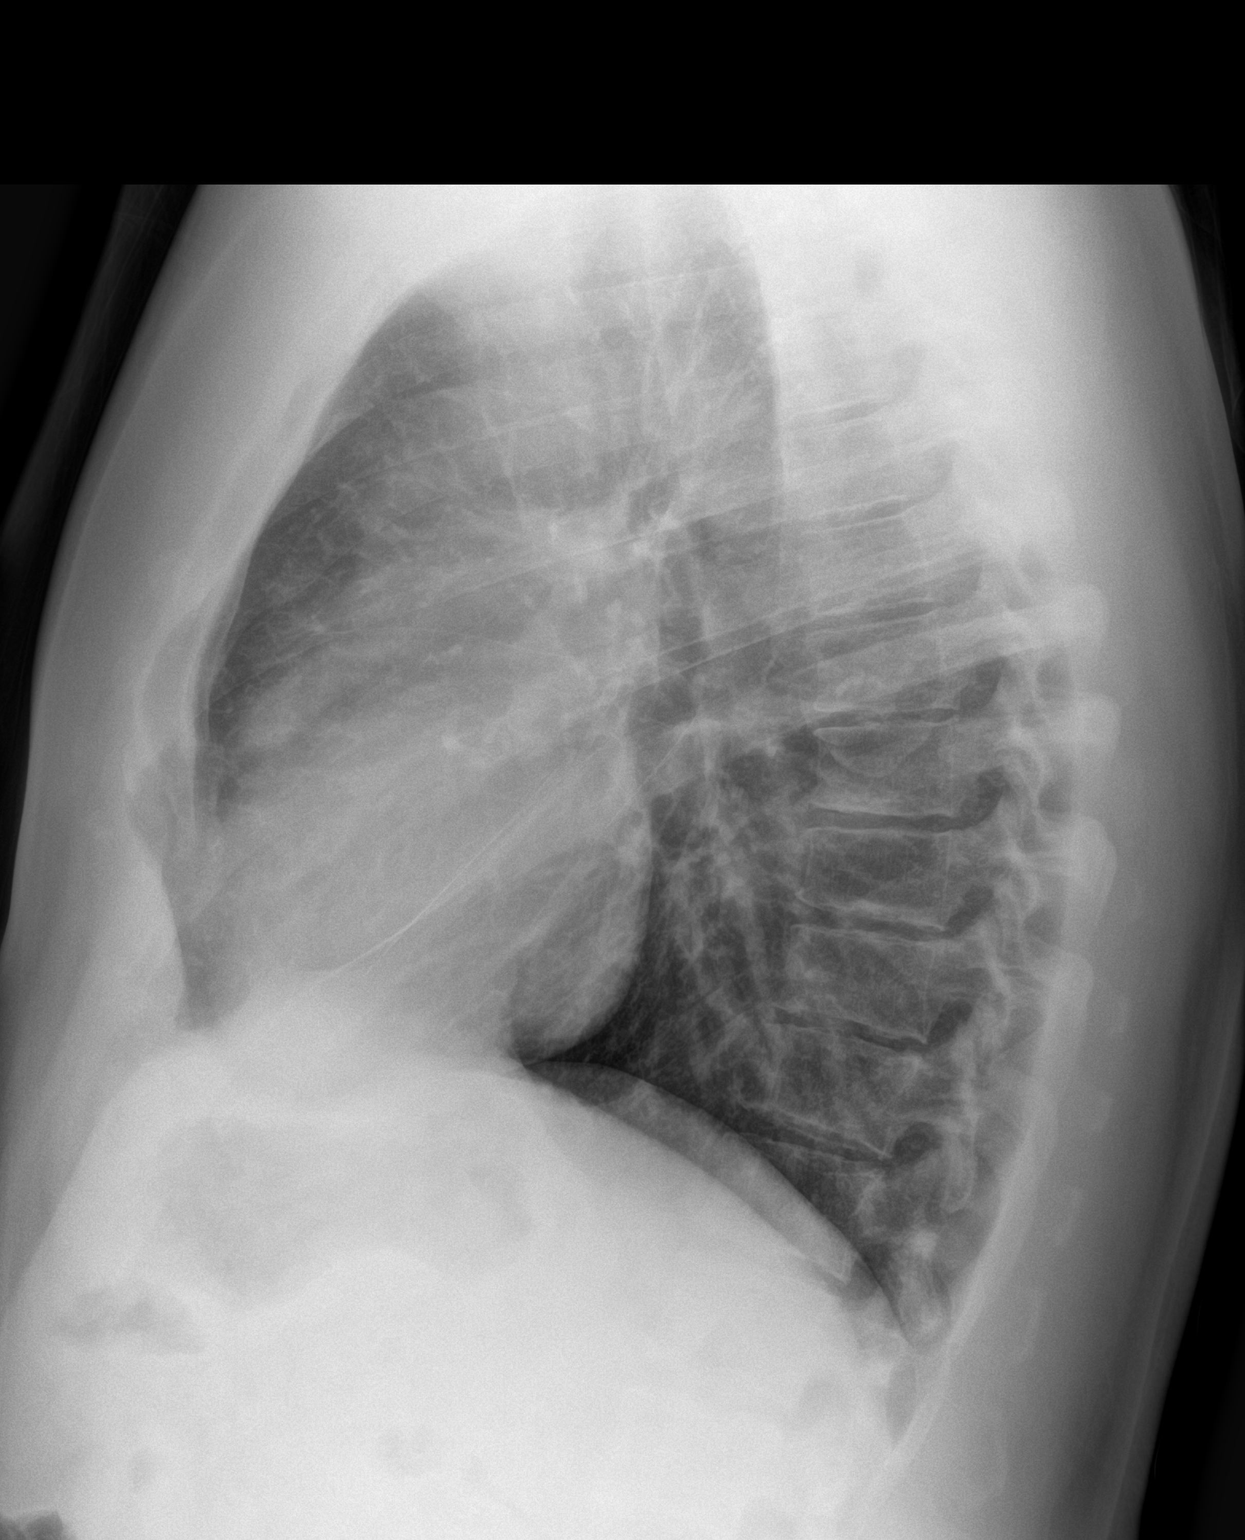

[2 of 2 positions shown; findings below may reference images not displayed]

FINDINGS: The heart size and mediastinal contours are within normal limits. No
focal airspace consolidation, pleural effusion, or pneumothorax. The
visualized skeletal structures are unremarkable.
IMPRESSION: No active cardiopulmonary disease.

## 2022-09-08 ENCOUNTER — Other Ambulatory Visit: Payer: Self-pay

## 2022-09-08 ENCOUNTER — Other Ambulatory Visit: Payer: Self-pay | Admitting: Internal Medicine

## 2022-09-08 DIAGNOSIS — I2510 Atherosclerotic heart disease of native coronary artery without angina pectoris: Secondary | ICD-10-CM

## 2022-09-08 DIAGNOSIS — I10 Essential (primary) hypertension: Secondary | ICD-10-CM

## 2022-09-09 ENCOUNTER — Other Ambulatory Visit: Payer: 59

## 2022-09-09 ENCOUNTER — Telehealth: Payer: Self-pay

## 2022-09-09 ENCOUNTER — Ambulatory Visit: Payer: 59

## 2022-09-09 ENCOUNTER — Ambulatory Visit: Payer: 59 | Admitting: Nurse Practitioner

## 2022-09-09 NOTE — Telephone Encounter (Signed)
MyChart message sent to patient with lab reminder.  

## 2022-09-09 NOTE — Telephone Encounter (Signed)
-----   Message from Yevette Edwards, RN sent at 07/29/2022  3:07 PM EDT ----- Regarding: H. pylori H. Pylori stool test due - order is in epic

## 2022-09-16 ENCOUNTER — Other Ambulatory Visit: Payer: Self-pay

## 2022-09-23 ENCOUNTER — Other Ambulatory Visit: Payer: Self-pay

## 2022-09-25 ENCOUNTER — Other Ambulatory Visit: Payer: Self-pay | Admitting: Physician Assistant

## 2022-09-25 DIAGNOSIS — C155 Malignant neoplasm of lower third of esophagus: Secondary | ICD-10-CM

## 2022-10-22 ENCOUNTER — Other Ambulatory Visit: Payer: Self-pay

## 2022-11-11 ENCOUNTER — Other Ambulatory Visit: Payer: Self-pay

## 2022-12-15 ENCOUNTER — Telehealth: Payer: Self-pay

## 2022-12-15 NOTE — Transitions of Care (Post Inpatient/ED Visit) (Unsigned)
   12/15/2022  Name: Jeffery Walker MRN: [id number] DOB: 1971-03-21  Today's TOC FU Call Status: Today's TOC FU Call Status:: Unsuccessul Call (1st Attempt) Unsuccessful Call (1st Attempt) Date: 12/15/22  Attempted to reach the patient regarding the most recent Inpatient/ED visit.  Follow Up Plan: Additional outreach attempts will be made to reach the patient to complete the Transitions of Care (Post Inpatient/ED visit) call.   Signature   Juanda Crumble LPN [place] Advisor Direct Dial [phone]      Today's TOC FU Call Status:: Unsuccessul Call (2nd Attempt) Unsuccessful Call (2nd Attempt) Date: 12/16/22   Juanda Crumble LPN [place] Direct Dial [phone]

## 2022-12-16 ENCOUNTER — Other Ambulatory Visit: Payer: Self-pay

## 2023-01-19 ENCOUNTER — Other Ambulatory Visit: Payer: Self-pay | Admitting: Gastroenterology

## 2023-02-02 DEATH — deceased
